# Patient Record
Sex: Female | Born: 1960
Health system: Southern US, Community
[De-identification: ages and names within clinical notes are randomized; demographics above are authoritative.]

## PROBLEM LIST (undated history)

## (undated) DIAGNOSIS — J45909 Unspecified asthma, uncomplicated: Secondary | ICD-10-CM

## (undated) DIAGNOSIS — I10 Essential (primary) hypertension: Secondary | ICD-10-CM

## (undated) DIAGNOSIS — T7840XA Allergy, unspecified, initial encounter: Secondary | ICD-10-CM

## (undated) DIAGNOSIS — M549 Dorsalgia, unspecified: Secondary | ICD-10-CM

## (undated) DIAGNOSIS — K5790 Diverticulosis of intestine, part unspecified, without perforation or abscess without bleeding: Secondary | ICD-10-CM

## (undated) DIAGNOSIS — M1990 Unspecified osteoarthritis, unspecified site: Secondary | ICD-10-CM

## (undated) DIAGNOSIS — Z5189 Encounter for other specified aftercare: Secondary | ICD-10-CM

## (undated) HISTORY — PX: POLYPECTOMY: SHX149

## (undated) HISTORY — DX: Essential (primary) hypertension: I10

## (undated) HISTORY — PX: TUBAL LIGATION: SHX77

## (undated) HISTORY — DX: Dorsalgia, unspecified: M54.9

## (undated) HISTORY — DX: Unspecified osteoarthritis, unspecified site: M19.90

## (undated) HISTORY — DX: Allergy, unspecified, initial encounter: T78.40XA

## (undated) HISTORY — DX: Diverticulosis of intestine, part unspecified, without perforation or abscess without bleeding: K57.90

## (undated) HISTORY — DX: Unspecified asthma, uncomplicated: J45.909

## (undated) HISTORY — DX: Encounter for other specified aftercare: Z51.89

## (undated) HISTORY — PX: TEMPOROMANDIBULAR JOINT ARTHROSCOPY: SUR97

## (undated) HISTORY — PX: COLONOSCOPY: SHX174

## (undated) HISTORY — PX: CHOLECYSTECTOMY: SHX55

---

## 1998-08-13 ENCOUNTER — Encounter (INDEPENDENT_AMBULATORY_CARE_PROVIDER_SITE_OTHER): Payer: Self-pay

## 1998-08-13 ENCOUNTER — Observation Stay (HOSPITAL_COMMUNITY): Admission: RE | Admit: 1998-08-13 | Discharge: 1998-08-14 | Payer: Self-pay | Admitting: Surgery

## 1999-05-22 ENCOUNTER — Encounter: Admission: RE | Admit: 1999-05-22 | Discharge: 1999-05-22 | Payer: Self-pay | Admitting: *Deleted

## 1999-06-04 ENCOUNTER — Ambulatory Visit (HOSPITAL_COMMUNITY): Admission: RE | Admit: 1999-06-04 | Discharge: 1999-06-04 | Payer: Self-pay | Admitting: *Deleted

## 1999-06-18 ENCOUNTER — Ambulatory Visit (HOSPITAL_COMMUNITY): Admission: RE | Admit: 1999-06-18 | Discharge: 1999-06-18 | Payer: Self-pay | Admitting: *Deleted

## 1999-07-03 ENCOUNTER — Ambulatory Visit (HOSPITAL_COMMUNITY): Admission: RE | Admit: 1999-07-03 | Discharge: 1999-07-03 | Payer: Self-pay | Admitting: *Deleted

## 2000-09-28 ENCOUNTER — Encounter: Payer: Self-pay | Admitting: Neurosurgery

## 2000-09-28 ENCOUNTER — Encounter: Admission: RE | Admit: 2000-09-28 | Discharge: 2000-09-28 | Payer: Self-pay | Admitting: Neurosurgery

## 2000-10-12 ENCOUNTER — Encounter: Payer: Self-pay | Admitting: Neurosurgery

## 2000-10-12 ENCOUNTER — Encounter: Admission: RE | Admit: 2000-10-12 | Discharge: 2000-10-12 | Payer: Self-pay | Admitting: Neurosurgery

## 2001-04-28 ENCOUNTER — Encounter: Payer: Self-pay | Admitting: Obstetrics and Gynecology

## 2001-04-28 ENCOUNTER — Encounter: Admission: RE | Admit: 2001-04-28 | Discharge: 2001-04-28 | Payer: Self-pay | Admitting: Obstetrics and Gynecology

## 2001-04-29 ENCOUNTER — Encounter (INDEPENDENT_AMBULATORY_CARE_PROVIDER_SITE_OTHER): Payer: Self-pay | Admitting: Specialist

## 2001-04-29 ENCOUNTER — Ambulatory Visit (HOSPITAL_BASED_OUTPATIENT_CLINIC_OR_DEPARTMENT_OTHER): Admission: RE | Admit: 2001-04-29 | Discharge: 2001-04-29 | Payer: Self-pay | Admitting: Obstetrics and Gynecology

## 2003-04-07 ENCOUNTER — Encounter: Admission: RE | Admit: 2003-04-07 | Discharge: 2003-04-07 | Payer: Self-pay | Admitting: Neurosurgery

## 2003-04-21 ENCOUNTER — Encounter: Admission: RE | Admit: 2003-04-21 | Discharge: 2003-04-21 | Payer: Self-pay | Admitting: Neurosurgery

## 2006-10-30 ENCOUNTER — Ambulatory Visit: Payer: Self-pay | Admitting: Gastroenterology

## 2006-11-13 ENCOUNTER — Ambulatory Visit: Payer: Self-pay | Admitting: Gastroenterology

## 2006-11-13 ENCOUNTER — Encounter: Payer: Self-pay | Admitting: Gastroenterology

## 2008-12-05 ENCOUNTER — Encounter: Payer: Self-pay | Admitting: Gastroenterology

## 2008-12-28 ENCOUNTER — Encounter: Admission: RE | Admit: 2008-12-28 | Discharge: 2008-12-28 | Payer: Self-pay | Admitting: Emergency Medicine

## 2008-12-28 ENCOUNTER — Encounter (INDEPENDENT_AMBULATORY_CARE_PROVIDER_SITE_OTHER): Payer: Self-pay | Admitting: *Deleted

## 2009-01-06 ENCOUNTER — Encounter: Admission: RE | Admit: 2009-01-06 | Discharge: 2009-01-06 | Payer: Self-pay | Admitting: Emergency Medicine

## 2009-01-06 ENCOUNTER — Encounter (INDEPENDENT_AMBULATORY_CARE_PROVIDER_SITE_OTHER): Payer: Self-pay | Admitting: *Deleted

## 2009-01-16 ENCOUNTER — Encounter (INDEPENDENT_AMBULATORY_CARE_PROVIDER_SITE_OTHER): Payer: Self-pay | Admitting: *Deleted

## 2009-02-12 ENCOUNTER — Ambulatory Visit: Payer: Self-pay | Admitting: Gastroenterology

## 2009-02-12 DIAGNOSIS — R7401 Elevation of levels of liver transaminase levels: Secondary | ICD-10-CM | POA: Insufficient documentation

## 2009-02-12 DIAGNOSIS — R740 Nonspecific elevation of levels of transaminase and lactic acid dehydrogenase [LDH]: Secondary | ICD-10-CM

## 2009-02-12 DIAGNOSIS — R933 Abnormal findings on diagnostic imaging of other parts of digestive tract: Secondary | ICD-10-CM | POA: Insufficient documentation

## 2009-05-31 ENCOUNTER — Encounter: Payer: Self-pay | Admitting: Gastroenterology

## 2009-06-19 ENCOUNTER — Telehealth: Payer: Self-pay | Admitting: Gastroenterology

## 2009-06-25 ENCOUNTER — Telehealth: Payer: Self-pay | Admitting: Gastroenterology

## 2009-06-27 ENCOUNTER — Ambulatory Visit: Payer: Self-pay | Admitting: Cardiovascular Disease

## 2009-07-10 ENCOUNTER — Encounter: Payer: Self-pay | Admitting: Gastroenterology

## 2010-03-17 ENCOUNTER — Encounter: Payer: Self-pay | Admitting: Gastroenterology

## 2010-03-26 NOTE — Letter (Signed)
Summary: [facility]  [facility]   Imported By: Sherian Rein 06/18/2009 08:00:30  _____________________________________________________________________  External Attachment:    Type:   Image     Comment:   External Document

## 2010-03-26 NOTE — Progress Notes (Signed)
Summary: Schedule MRI   Phone Note Outgoing Call Call back at [facility] Phone [phone] Call back at Work Phone [phone]   Call placed by: Christie Nottingham CMA Duncan Dull),  June 19, 2009 10:10 AM Call placed to: Patient Summary of Call: Scheduled MRI of the abdomen with and without contrast to follow-up for the liver lesions. Pt notified it is on 06/25/09 at 9:45am at [facility] (315 location). Pt states she might have to call and reschedule for later on in the week. Phone number given to pt.  Initial call taken by: Christie Nottingham CMA Duncan Dull),  June 19, 2009 10:11 AM

## 2010-03-26 NOTE — Progress Notes (Signed)
Summary: Schedule CT scan   Phone Note Outgoing Call Call back at [facility] Phone [phone]   Call placed by: Christie Nottingham CMA Duncan Dull),  Jun 25, 2009 2:10 PM Call placed to: Patient Summary of Call: Pt's insurance company would not pay for a MRI of the abdomen per Morrie Sheldon (Presenter, broadcasting). Pt has been approved for a CT of the liver. Pt has been rescheduled to 06/27/09 at 9:30am at [facility] CT. Pt has been notified and states she will come and pick up instructions and contrast from the front desk.  Initial call taken by: Christie Nottingham CMA Duncan Dull),  Jun 25, 2009 2:12 PM

## 2010-04-01 ENCOUNTER — Other Ambulatory Visit: Payer: Self-pay | Admitting: Emergency Medicine

## 2010-04-01 DIAGNOSIS — K769 Liver disease, unspecified: Secondary | ICD-10-CM

## 2010-04-04 ENCOUNTER — Encounter (INDEPENDENT_AMBULATORY_CARE_PROVIDER_SITE_OTHER): Payer: Self-pay | Admitting: *Deleted

## 2010-04-04 ENCOUNTER — Encounter: Payer: Self-pay | Admitting: Gastroenterology

## 2010-04-04 ENCOUNTER — Ambulatory Visit
Admission: RE | Admit: 2010-04-04 | Discharge: 2010-04-04 | Disposition: A | Discharge status: Home / Self Care | Payer: 59 | Source: Ambulatory Visit | Attending: Emergency Medicine | Admitting: Emergency Medicine

## 2010-04-04 DIAGNOSIS — K769 Liver disease, unspecified: Secondary | ICD-10-CM

## 2010-04-04 MED ORDER — GADOBENATE DIMEGLUMINE 529 MG/ML IV SOLN
15.0000 mL | Freq: Once | INTRAVENOUS | Status: AC | PRN
  Administered 2010-04-04: 15 mL via INTRAVENOUS

## 2010-07-12 NOTE — Op Note (Signed)
[facility]  Patient:    Theresa Bell, Theresa Bell Visit Number: [id number] MRN: [id number]          Service Type: NES Location: [facility] Attending Physician:  Lendon Colonel Dictated by:   Kathie Rhodes. Kyra Manges, M.D. Proc. Date: 04/29/01 Admit Date:  04/29/2001                             Operative Report  PREOPERATIVE DIAGNOSES: 1. Pelvic pain. 2. Abnormal uterine bleeding.  POSTOPERATIVE DIAGNOSIS:  Essentially normal pelvis.  OPERATION:  Laparoscopic and hysteroscopy with resection of posterior endometrial nodule.  DESCRIPTION OF PROCEDURE:  The patient was placed in lithotomy position, prepped and draped in the usual fashion.  Bladder was emptied.  The patient had been positioned in the stirrups prior to being anesthetized because of her history of what was thought to be a herniated nucleus.  Transverse incision was made in the umbilicus after prep and drape, and the bladder was emptied, and the abdomen was distended with carbon dioxide.  The trocar was inserted into the abdomen and visualization of the pelvis accomplishes.  She was status post tubal ligation with two normal ovaries.  The uterosacral ligaments were traced to their insertion and were normal.  The pelvic brim and ______ were all normal.  Anteriorly, she was normal as well.  Pictures were taken of both ovaries of the cul-de-sac and anterior and posterior cul-de-sacs.  Upper abdomen was quite normal as well.  Nothing was done; the gas was evacuated, and the skin was closed with 0 Vicryl on the UR-6 needle for the umbilicus.  I had placed a second midline lower port to manipulate the ovaries, and this was closed with 3-0 Vicryl as well as the skin for the umbilicus with 3-0 Vicryl. The incisions were then infiltrated with 0.5% Marcaine with epinephrine.  We went down below and carefully dilated the cervix and inserted the resectoscope into the cervix.  Visualization of the endometrial  cavity was quite normal. There was a posterior nodule which I think was a small myoma which I resected very carefully.  The resected material was sent to the lab for study.  [name] was then awakened and carried to the recovery room in good condition. Dictated by:   S. Kyra Manges, M.D. Attending Physician:  Lendon Colonel DD:  04/29/01 TD:  04/29/01 Job: [id number]

## 2011-03-31 ENCOUNTER — Telehealth: Payer: Self-pay

## 2011-03-31 DIAGNOSIS — R933 Abnormal findings on diagnostic imaging of other parts of digestive tract: Secondary | ICD-10-CM

## 2011-03-31 NOTE — Telephone Encounter (Signed)
Scheduled yearly Hepatic MRI to follow up on the Hepatic lobe lesion from 03/2010 with [facility] for 04/10/11 at 9:30am at the [address] [facility] location. Patient informed of date and time of MRI and to have nothing to eat or drink after midnight before the test.

## 2011-04-01 ENCOUNTER — Other Ambulatory Visit: Payer: Self-pay | Admitting: Emergency Medicine

## 2011-04-01 NOTE — Telephone Encounter (Signed)
Needs CPE 

## 2011-04-03 ENCOUNTER — Other Ambulatory Visit: Payer: Self-pay | Admitting: Emergency Medicine

## 2011-04-09 ENCOUNTER — Other Ambulatory Visit: Payer: Self-pay | Admitting: Family Medicine

## 2011-04-09 MED ORDER — LISINOPRIL 10 MG PO TABS: 10.0000 mg | ORAL_TABLET | Freq: Every day | ORAL | Status: DC

## 2011-04-10 ENCOUNTER — Ambulatory Visit
Admission: RE | Admit: 2011-04-10 | Discharge: 2011-04-10 | Disposition: A | Discharge status: Home / Self Care | Payer: BC Managed Care – PPO | Source: Ambulatory Visit | Attending: Gastroenterology | Admitting: Gastroenterology

## 2011-04-10 ENCOUNTER — Other Ambulatory Visit: Payer: Self-pay

## 2011-04-10 DIAGNOSIS — R7401 Elevation of levels of liver transaminase levels: Secondary | ICD-10-CM

## 2011-04-10 DIAGNOSIS — R933 Abnormal findings on diagnostic imaging of other parts of digestive tract: Secondary | ICD-10-CM

## 2011-04-10 MED ORDER — GADOBENATE DIMEGLUMINE 529 MG/ML IV SOLN
15.0000 mL | Freq: Once | INTRAVENOUS | Status: AC | PRN
  Administered 2011-04-10: 15 mL via INTRAVENOUS

## 2011-04-11 ENCOUNTER — Other Ambulatory Visit: Payer: Self-pay

## 2011-04-11 ENCOUNTER — Other Ambulatory Visit (INDEPENDENT_AMBULATORY_CARE_PROVIDER_SITE_OTHER): Payer: BC Managed Care – PPO

## 2011-04-11 DIAGNOSIS — R7401 Elevation of levels of liver transaminase levels: Secondary | ICD-10-CM

## 2011-04-11 LAB — HEPATIC FUNCTION PANEL
ALT: 51 U/L — ABNORMAL HIGH (ref 0–35)
AST: 42 U/L — ABNORMAL HIGH (ref 0–37)
Albumin: 3.5 g/dL (ref 3.5–5.2)
Alkaline Phosphatase: 66 U/L (ref 39–117)
Bilirubin, Direct: 0.2 mg/dL (ref 0.0–0.3)
Total Bilirubin: 1.1 mg/dL (ref 0.3–1.2)
Total Protein: 7.2 g/dL (ref 6.0–8.3)

## 2011-04-26 ENCOUNTER — Ambulatory Visit (INDEPENDENT_AMBULATORY_CARE_PROVIDER_SITE_OTHER): Payer: BC Managed Care – PPO | Admitting: Emergency Medicine

## 2011-04-26 DIAGNOSIS — E559 Vitamin D deficiency, unspecified: Secondary | ICD-10-CM | POA: Insufficient documentation

## 2011-04-26 DIAGNOSIS — E789 Disorder of lipoprotein metabolism, unspecified: Secondary | ICD-10-CM

## 2011-04-26 DIAGNOSIS — I10 Essential (primary) hypertension: Secondary | ICD-10-CM

## 2011-04-26 DIAGNOSIS — Z79890 Hormone replacement therapy (postmenopausal): Secondary | ICD-10-CM | POA: Insufficient documentation

## 2011-04-26 DIAGNOSIS — K7689 Other specified diseases of liver: Secondary | ICD-10-CM

## 2011-04-26 MED ORDER — NORETHINDRONE ACETATE 5 MG PO TABS: 5.0000 mg | ORAL_TABLET | Freq: Every day | ORAL | Status: DC

## 2011-04-26 MED ORDER — TRIAMTERENE-HCTZ 37.5-25 MG PO TABS: 1.0000 | ORAL_TABLET | Freq: Every morning | ORAL | Status: DC

## 2011-04-26 MED ORDER — LISINOPRIL 10 MG PO TABS: 10.0000 mg | ORAL_TABLET | Freq: Every day | ORAL | Status: DC

## 2011-04-26 MED ORDER — VITAMIN D (ERGOCALCIFEROL) 1.25 MG (50000 UNIT) PO CAPS: 50000.0000 [IU] | ORAL_CAPSULE | ORAL | Status: DC

## 2011-04-26 NOTE — Progress Notes (Signed)
  Subjective:    Patient ID: Theresa Bell, female    DOB: 06-Aug-1960, 51 y.o.   MRN: [id number]  HPI patient enters for followup of her hypertension. She's been continuing on her medication but does not have a followup appointment for approximately 2 months. She is out of her medications and needs medication refills.    Review of Systems specifically patient denies chest pain shortness of breath or any other complaints at this time.     Objective:   Physical Exam HEENT exam is within normal limits neck supple chest clear heart regular rate no murmurs rubs or gallops abdomen is soft without tenderness.        Assessment & Plan:  Assessment is hypertension under good control. Patient also has vitamin D deficiency and is on vitamin D replacement. She also has menstrual problems and is currently on Norethindrone .Marland Kitchen

## 2011-07-15 ENCOUNTER — Other Ambulatory Visit (INDEPENDENT_AMBULATORY_CARE_PROVIDER_SITE_OTHER): Payer: BC Managed Care – PPO

## 2011-07-15 ENCOUNTER — Other Ambulatory Visit: Payer: Self-pay

## 2011-07-15 DIAGNOSIS — R7401 Elevation of levels of liver transaminase levels: Secondary | ICD-10-CM

## 2011-07-15 DIAGNOSIS — R7989 Other specified abnormal findings of blood chemistry: Secondary | ICD-10-CM

## 2011-07-15 LAB — HEPATIC FUNCTION PANEL
AST: 58 U/L — ABNORMAL HIGH (ref 0–37)
Alkaline Phosphatase: 60 U/L (ref 39–117)
Total Bilirubin: 1.2 mg/dL (ref 0.3–1.2)

## 2011-07-16 ENCOUNTER — Other Ambulatory Visit (INDEPENDENT_AMBULATORY_CARE_PROVIDER_SITE_OTHER): Payer: BC Managed Care – PPO

## 2011-07-16 DIAGNOSIS — R7989 Other specified abnormal findings of blood chemistry: Secondary | ICD-10-CM

## 2011-07-16 LAB — FERRITIN: Ferritin: 82.2 ng/mL (ref 10.0–291.0)

## 2011-07-16 LAB — PROTIME-INR: INR: 1 ratio (ref 0.8–1.0)

## 2011-07-16 LAB — IRON: Iron: 76 ug/dL (ref 42–145)

## 2011-07-17 LAB — ANTI-SMOOTH MUSCLE ANTIBODY, IGG: Smooth Muscle Ab: 5 U (ref <20)

## 2011-07-17 LAB — HEPATITIS B SURFACE ANTIGEN: Hepatitis B Surface Ag: NEGATIVE

## 2011-07-17 LAB — HEPATITIS B SURFACE ANTIBODY,QUALITATIVE: Hep B S Ab: POSITIVE — AB

## 2011-07-17 LAB — CERULOPLASMIN: Ceruloplasmin: 34 mg/dL (ref 20–60)

## 2011-07-17 LAB — ALPHA-1-ANTITRYPSIN: A-1 Antitrypsin, Ser: 153 mg/dL (ref 90–200)

## 2011-07-17 LAB — ANA: Anti Nuclear Antibody(ANA): POSITIVE — AB

## 2011-07-17 LAB — HEPATITIS C ANTIBODY: HCV Ab: NEGATIVE

## 2011-07-17 LAB — MITOCHONDRIAL ANTIBODIES: Mitochondrial M2 Ab, IgG: 0.38 (ref <0.91)

## 2011-09-24 ENCOUNTER — Encounter: Payer: Self-pay | Admitting: Gastroenterology

## 2011-11-05 ENCOUNTER — Encounter: Payer: Self-pay | Admitting: Emergency Medicine

## 2011-11-10 ENCOUNTER — Ambulatory Visit (AMBULATORY_SURGERY_CENTER): Payer: BC Managed Care – PPO

## 2011-11-10 VITALS — Ht 64.5 in | Wt 182.0 lb

## 2011-11-10 DIAGNOSIS — Z800 Family history of malignant neoplasm of digestive organs: Secondary | ICD-10-CM

## 2011-11-10 DIAGNOSIS — Z1211 Encounter for screening for malignant neoplasm of colon: Secondary | ICD-10-CM

## 2011-11-10 MED ORDER — MOVIPREP 100 G PO SOLR: ORAL | Status: DC

## 2011-11-11 ENCOUNTER — Encounter: Payer: Self-pay | Admitting: Gastroenterology

## 2011-12-01 ENCOUNTER — Ambulatory Visit (AMBULATORY_SURGERY_CENTER): Payer: BC Managed Care – PPO | Admitting: Gastroenterology

## 2011-12-01 ENCOUNTER — Encounter: Payer: Self-pay | Admitting: Gastroenterology

## 2011-12-01 VITALS — BP 115/63 | HR 89 | Temp 98.2°F | Resp 18 | Ht 64.5 in | Wt 182.0 lb

## 2011-12-01 DIAGNOSIS — Z1211 Encounter for screening for malignant neoplasm of colon: Secondary | ICD-10-CM

## 2011-12-01 DIAGNOSIS — Z800 Family history of malignant neoplasm of digestive organs: Secondary | ICD-10-CM

## 2011-12-01 MED ORDER — SODIUM CHLORIDE 0.9 % IV SOLN: 500.0000 mL | INTRAVENOUS | Status: DC

## 2011-12-01 NOTE — Op Note (Addendum)
[facility] [address] [place], [address]   COLONOSCOPY PROCEDURE REPORT  PATIENT: Theresa Bell, Theresa Bell  MR#: [id number] BIRTHDATE: 01/18/1961 , 51  yrs. old GENDER: Female ENDOSCOPIST: Meryl Dare, MD, [facility] PROCEDURE DATE:  12/01/2011 PROCEDURE:   Colonoscopy, diagnostic ASA CLASS:   Class II INDICATIONS:patient's family history of colon cancer, distant relatives: MGM age 50, maternal 1st cousin age 64 MEDICATIONS: MAC sedation, administered by CRNA and propofol (Diprivan) 180mg  IV DESCRIPTION OF PROCEDURE:   After the risks benefits and alternatives of the procedure were thoroughly explained, informed consent was obtained.  A digital rectal exam revealed no abnormalities of the rectum.   The LB CF-[id number]  endoscope was introduced through the anus and advanced to the cecum, which was identified by both the appendix and ileocecal valve. No adverse events experienced.   The quality of the prep was good, using MoviPrep  The instrument was then slowly withdrawn as the colon was fully examined.  COLON FINDINGS: Mild diverticulosis was noted in the sigmoid colon. The colon was otherwise normal.  There was no diverticulosis, inflammation, polyps or cancers unless previously stated. Retroflexed views revealed no abnormalities. The time to cecum=1 minutes 26 seconds.  Withdrawal time=7 minutes 14 seconds.  The scope was withdrawn and the procedure completed. COMPLICATIONS: There were no complications.  ENDOSCOPIC IMPRESSION: 1.   Mild diverticulosis was noted in the sigmoid colon 2.   The colon was otherwise normal  RECOMMENDATIONS: 1.  High fiber diet with liberal fluid intake. 2.  Repeat Colonoscopy in 5 years.   eSigned:  Meryl Dare, MD, [facility] 12/01/2011 9:56 AM Revised: 12/01/2011 9:56 AM  cc: Lesle Chris, MD

## 2011-12-01 NOTE — Progress Notes (Signed)
Propofol per B Gordy Levan CRNA, see scanned intra procedure report. All  meds titrated during procedure per CRNA. ewm

## 2011-12-01 NOTE — Patient Instructions (Signed)
YOU HAD AN ENDOSCOPIC PROCEDURE TODAY AT THE  ENDOSCOPY CENTER: Refer to the procedure report that was given to you for any specific questions about what was found during the examination.  If the procedure report does not answer your questions, please call your gastroenterologist to clarify.  If you requested that your care partner not be given the details of your procedure findings, then the procedure report has been included in a sealed envelope for you to review at your convenience later.  YOU SHOULD EXPECT: Some feelings of bloating in the abdomen. Passage of more gas than usual.  Walking can help get rid of the air that was put into your GI tract during the procedure and reduce the bloating. If you had a lower endoscopy (such as a colonoscopy or flexible sigmoidoscopy) you may notice spotting of blood in your stool or on the toilet paper. If you underwent a bowel prep for your procedure, then you may not have a normal bowel movement for a few days.  DIET: Your first meal following the procedure should be a light meal and then it is ok to progress to your normal diet.  A half-sandwich or bowl of soup is an example of a good first meal.  Heavy or fried foods are harder to digest and may make you feel nauseous or bloated.  Likewise meals heavy in dairy and vegetables can cause extra gas to form and this can also increase the bloating.  Drink plenty of fluids but you should avoid alcoholic beverages for 24 hours.  ACTIVITY: Your care partner should take you home directly after the procedure.  You should plan to take it easy, moving slowly for the rest of the day.  You can resume normal activity the day after the procedure however you should NOT DRIVE or use heavy machinery for 24 hours (because of the sedation medicines used during the test).    SYMPTOMS TO REPORT IMMEDIATELY: A gastroenterologist can be reached at any hour.  During normal business hours, 8:30 AM to 5:00 PM Monday through Friday,  call [phone].  After hours and on weekends, please call the GI answering service at [phone] who will take a message and have the physician on call contact you.   Following lower endoscopy (colonoscopy or flexible sigmoidoscopy):  Excessive amounts of blood in the stool  Significant tenderness or worsening of abdominal pains  Swelling of the abdomen that is new, acute  Fever of 100F or higher   FOLLOW UP: If any biopsies were taken you will be contacted by phone or by letter within the next 1-3 weeks.  Call your gastroenterologist if you have not heard about the biopsies in 3 weeks.  Our staff will call the home number listed on your records the next business day following your procedure to check on you and address any questions or concerns that you may have at that time regarding the information given to you following your procedure. This is a courtesy call and so if there is no answer at the home number and we have not heard from you through the emergency physician on call, we will assume that you have returned to your regular daily activities without incident.  SIGNATURES/CONFIDENTIALITY: You and/or your care partner have signed paperwork which will be entered into your electronic medical record.  These signatures attest to the fact that that the information above on your After Visit Summary has been reviewed and is understood.  Full responsibility of the confidentiality of   this discharge information lies with you and/or your care-partner.    INFORMATION ON DIVERTICULOSIS & HIGH FIBER DIET GIVEN TO YOU TODAY 

## 2011-12-02 ENCOUNTER — Telehealth: Payer: Self-pay | Admitting: *Deleted

## 2011-12-02 NOTE — Telephone Encounter (Signed)
Message left

## 2012-02-06 ENCOUNTER — Ambulatory Visit: Payer: BC Managed Care – PPO

## 2012-02-06 ENCOUNTER — Ambulatory Visit (INDEPENDENT_AMBULATORY_CARE_PROVIDER_SITE_OTHER): Payer: BC Managed Care – PPO | Admitting: Emergency Medicine

## 2012-02-06 VITALS — BP 139/84 | HR 86 | Temp 98.1°F | Resp 16 | Ht 64.0 in | Wt 179.0 lb

## 2012-02-06 DIAGNOSIS — R82998 Other abnormal findings in urine: Secondary | ICD-10-CM

## 2012-02-06 DIAGNOSIS — N889 Noninflammatory disorder of cervix uteri, unspecified: Secondary | ICD-10-CM

## 2012-02-06 DIAGNOSIS — R9389 Abnormal findings on diagnostic imaging of other specified body structures: Secondary | ICD-10-CM

## 2012-02-06 DIAGNOSIS — I10 Essential (primary) hypertension: Secondary | ICD-10-CM

## 2012-02-06 DIAGNOSIS — Z0000 Encounter for general adult medical examination without abnormal findings: Secondary | ICD-10-CM

## 2012-02-06 DIAGNOSIS — R8281 Pyuria: Secondary | ICD-10-CM

## 2012-02-06 DIAGNOSIS — Z79890 Hormone replacement therapy (postmenopausal): Secondary | ICD-10-CM

## 2012-02-06 DIAGNOSIS — R7989 Other specified abnormal findings of blood chemistry: Secondary | ICD-10-CM

## 2012-02-06 LAB — COMPREHENSIVE METABOLIC PANEL
ALT: 38 U/L — ABNORMAL HIGH (ref 0–35)
AST: 29 U/L (ref 0–37)
Albumin: 4.1 g/dL (ref 3.5–5.2)
Alkaline Phosphatase: 65 U/L (ref 39–117)
BUN: 8 mg/dL (ref 6–23)
CO2: 27 mEq/L (ref 19–32)
Calcium: 9.3 mg/dL (ref 8.4–10.5)
Chloride: 101 mEq/L (ref 96–112)
Creat: 0.98 mg/dL (ref 0.50–1.10)
Glucose, Bld: 103 mg/dL — ABNORMAL HIGH (ref 70–99)
Potassium: 4.1 mEq/L (ref 3.5–5.3)
Sodium: 137 mEq/L (ref 135–145)
Total Bilirubin: 1.4 mg/dL — ABNORMAL HIGH (ref 0.3–1.2)
Total Protein: 6.8 g/dL (ref 6.0–8.3)

## 2012-02-06 LAB — POCT UA - MICROSCOPIC ONLY
Casts, Ur, LPF, POC: NEGATIVE
Crystals, Ur, HPF, POC: NEGATIVE
Mucus, UA: NEGATIVE

## 2012-02-06 LAB — POCT URINALYSIS DIPSTICK
Spec Grav, UA: >=1.03
Urobilinogen, UA: 0.2

## 2012-02-06 LAB — LIPID PANEL
Cholesterol: 165 mg/dL (ref 0–200)
Total CHOL/HDL Ratio: 5.3 Ratio
Triglycerides: 146 mg/dL (ref <150)
VLDL: 29 mg/dL (ref 0–40)

## 2012-02-06 LAB — POCT CBC
Hemoglobin: 15.3 g/dL (ref 12.2–16.2)
Lymph, poc: 1.2 (ref 0.6–3.4)
MCH, POC: 28.3 pg (ref 27–31.2)
MCV: 90.1 fL (ref 80–97)
MID (cbc): 0.5 (ref 0–0.9)
Platelet Count, POC: 436 10*3/uL — AB (ref 142–424)
RBC: 5.41 M/uL (ref 4.04–5.48)
WBC: 6.2 10*3/uL (ref 4.6–10.2)

## 2012-02-06 MED ORDER — TRIAMTERENE-HCTZ 37.5-25 MG PO TABS: 1.0000 | ORAL_TABLET | Freq: Every morning | ORAL | Status: DC

## 2012-02-06 MED ORDER — NORETHINDRONE ACETATE 5 MG PO TABS: 5.0000 mg | ORAL_TABLET | Freq: Every day | ORAL | Status: DC

## 2012-02-06 MED ORDER — LISINOPRIL 10 MG PO TABS: 20.0000 mg | ORAL_TABLET | Freq: Every day | ORAL | Status: DC

## 2012-02-06 MED ORDER — LISINOPRIL 10 MG PO TABS: 10.0000 mg | ORAL_TABLET | Freq: Every day | ORAL | Status: DC

## 2012-02-06 NOTE — Progress Notes (Signed)
@[email or web address] @  Patient ID: Theresa Bell MRN: [id number], DOB: 20-Jun-1960, 51 y.o. Date of Encounter: 02/06/2012, 9:23 AM  Primary Physician: Lucilla Edin, MD  Chief Complaint: Physical (CPE)  HPI: 51 y.o. y/o female with history of noted below here for CPE.  Doing well. No issues/complaints.  LMP:  Pap: MMG: Review of Systems:  Consitutional: No fever, chills, fatigue, night sweats, lymphadenopathy, or weight changes. Eyes: No visual changes, eye redness, or discharge. ENT/Mouth: Ears: No otalgia, tinnitus, hearing loss, discharge. Nose: No congestion, rhinorrhea, sinus pain, or epistaxis. Throat: No sore throat, post nasal drip, or teeth pain. Cardiovascular: No CP, palpitations, diaphoresis, DOE, edema, orthopnea, PND. Respiratory: She has a history of exercise-induced asthma she has Dulera to take but rarely takes it. As a pro-air inhaler to use as  Gastrointestinal: No anorexia, dysphagia, reflux, pain, nausea, vomiting, hematemesis, diarrhea, constipation, BRBPR, or melena. Breast: No discharge, pain, swelling, or mass. Genitourinary: No dysuria, frequency, urgency, hematuria, incontinence, nocturia, amenorrhea, vaginal discharge, pruritis, burning, abnormal bleeding, or pain. Musculoskeletal: No decreased ROM, myalgias, stiffness, joint swelling, or weakness. Skin: No rash, erythema, lesion changes, pain, warmth, jaundice, or pruritis. Neurological: No headache, dizziness, syncope, seizures, tremors, memory loss, coordination problems, or paresthesias. Psychological: No anxiety, depression, hallucinations, SI/HI. Endocrine: No fatigue, polydipsia, polyphagia, polyuria, or known diabetes. All other systems were reviewed and are otherwise negative.  Past Medical History  Diagnosis Date  . Hypertension   . Back pain   . Allergy      Past Surgical History  Procedure Date  . Tubal ligation   . Cholecystectomy   . Colonoscopy   . Polypectomy   . Temporomandibular joint  arthroscopy     Home Meds:  Prior to Admission medications   Medication Sig Start Date End Date Taking? Authorizing Provider  Ergocalciferol (VITAMIN D2 PO) Take 1.25 mg by mouth daily.   Yes Historical Provider, MD  lisinopril (PRINIVIL,ZESTRIL) 10 MG tablet Take 1 tablet (10 mg total) by mouth daily. 04/26/11 04/25/12 Yes Collene Gobble, MD  norethindrone (AYGESTIN) 5 MG tablet Take 1 tablet (5 mg total) by mouth daily. 04/26/11  Yes Collene Gobble, MD  triamterene-hydrochlorothiazide (MAXZIDE-25) 37.5-25 MG per tablet Take 1 each (1 tablet total) by mouth every morning. 04/26/11  Yes Collene Gobble, MD  Vitamin D, Ergocalciferol, (DRISDOL) 50000 UNITS CAPS Take 1 capsule (50,000 Units total) by mouth every 7 (seven) days. 04/26/11  Yes Collene Gobble, MD    Allergies:  Allergies  Allergen Reactions  . Codeine     REACTION: Nausea  . Darvocet (Propoxyphene-Acetaminophen)   . Darvon     History   Social History  . Marital Status: Married    Spouse Name: N/A    Number of Children: N/A  . Years of Education: N/A   Occupational History  . Not on file.   Social History Main Topics  . Smoking status: Former Smoker    Types: Cigarettes    Start date: 11/10/1982  . Smokeless tobacco: Never Used  . Alcohol Use: 2.5 oz/week    5 drink(s) per week  . Drug Use: No  . Sexually Active: Not on file   Other Topics Concern  . Not on file   Social History Narrative  . No narrative on file    Family History  Problem Relation Age of Onset  . Breast cancer Paternal Aunt   . Uterine cancer Maternal Grandmother   . Colon cancer Paternal Grandmother     Physical  Exam  Blood pressure 139/84, pulse 86, temperature 98.1 F (36.7 C), temperature source Oral, resp. rate 16, height 5\' 4"  (1.626 m), weight 179 lb (81.194 kg), SpO2 100.00%., Body mass index is 30.73 kg/(m^2). General: Well developed, well nourished, in no acute distress. HEENT: Normocephalic, atraumatic. Conjunctiva pink, sclera  non-icteric. Pupils 2 mm constricting to 1 mm, round, regular, and equally reactive to light and accomodation. EOMI. Internal auditory canal clear. TMs with good cone of light and without pathology. Nasal mucosa pink. Nares are without discharge. No sinus tenderness. Oral mucosa pink. Dentitionnormal. Pharynx without exudate.   Neck: Supple. Trachea midline. No thyromegaly. Full ROM. No lymphadenopathy. Lungs: Clear to auscultation bilaterally without wheezes, rales, or rhonchi. Breathing is of normal effort and unlabored. Cardiovascular: RRR with S1 S2. No murmurs, rubs, or gallops appreciated. Distal pulses 2+ symmetrically. No carotid or abdominal bruits.  Breast: There are no breast masses palpable  Abdomen: Soft, non-tender, non-distended with normoactive bowel sounds. No hepatosplenomegaly or masses. No rebound/guarding. No CVA tenderness. Without hernias.  Genitourinary: The area around the cervical os is extremely friable. It bleeds freely. There are no adnexal masses present there is no uterine mass. Rectal was also performed and he was sure was sent .    Musculoskeletal: Full range of motion and 5/5 strength throughout. Without swelling, atrophy, tenderness, crepitus, or warmth. Extremities without clubbing, cyanosis, or edema. Calves supple. Skin: Warm and moist without erythema, ecchymosis, wounds, or rash. Neuro: A+Ox3. CN II-XII grossly intact. Moves all extremities spontaneously. Full sensation throughout. Normal gait. DTR 2+ throughout upper and lower extremities. Finger to nose intact. Psych:  Responds to questions appropriately with a normal affect.    Results for orders placed in visit on 02/06/12  POCT CBC      Component Value Range   WBC 6.2  4.6 - 10.2 K/uL   Lymph, poc 1.2  0.6 - 3.4   POC LYMPH PERCENT 19.9  10 - 50 %L   MID (cbc) 0.5  0 - 0.9   POC MID % 7.9  0 - 12 %M   POC Granulocyte 4.5  2 - 6.9   Granulocyte percent 72.2  37 - 80 %G   RBC 5.41  4.04 - 5.48  M/uL   Hemoglobin 15.3  12.2 - 16.2 g/dL   HCT, POC 16.1 (*) 09.6 - 47.9 %   MCV 90.1  80 - 97 fL   MCH, POC 28.3  27 - 31.2 pg   MCHC 31.4 (*) 31.8 - 35.4 g/dL   RDW, POC 04.5     Platelet Count, POC 436 (*) 142 - 424 K/uL   MPV 8.5  0 - 99.8 fL  POCT UA - MICROSCOPIC ONLY      Component Value Range   WBC, Ur, HPF, POC 8-12     RBC, urine, microscopic 10-15     Bacteria, U Microscopic 1+     Mucus, UA neg     Epithelial cells, urine per micros 6-10     Crystals, Ur, HPF, POC neg     Casts, Ur, LPF, POC neg     Yeast, UA neg    POCT URINALYSIS DIPSTICK      Component Value Range   Color, UA dk yellow     Clarity, UA clear     Glucose, UA neg     Bilirubin, UA small     Ketones, UA neg     Spec Grav, UA >=1.030     Blood,  UA moderate     pH, UA 6.0     Protein, UA trace     Urobilinogen, UA 0.2     Nitrite, UA neg     Leukocytes, UA small (1+)    IFOBT (OCCULT BLOOD)      Component Value Range   IFOBT Negative     [facility] reading (PRIMARY) by  Dr.Daub on 2 spiculated areas in what  appears to be the left upper lobe.    Assessment/Plan:  51 y.o. y/o female here for CPE I'm concerned about her friable mucosa around the cervix. She is also currently on hormone therapy. Routine labs were performed. She does have an abnormal chest x-ray apical views were obtained we'll go ahead and schedule CT of the chest with contrast to evaluate these areas . -  Signed, Earl Lites, MD 02/06/2012 9:23 AM

## 2012-02-08 LAB — URINE CULTURE

## 2012-02-09 ENCOUNTER — Telehealth: Payer: Self-pay | Admitting: Radiology

## 2012-02-09 LAB — PAP IG (IMAGE GUIDED)

## 2012-02-09 NOTE — Telephone Encounter (Signed)
CT of Chest needs peer to peer [phone] memver # [id number]

## 2012-02-10 NOTE — Telephone Encounter (Signed)
auth # - [id number]

## 2012-02-23 ENCOUNTER — Ambulatory Visit
Admission: RE | Admit: 2012-02-23 | Discharge: 2012-02-23 | Disposition: A | Discharge status: Home / Self Care | Payer: BC Managed Care – PPO | Source: Ambulatory Visit | Attending: Emergency Medicine | Admitting: Emergency Medicine

## 2012-02-23 DIAGNOSIS — R9389 Abnormal findings on diagnostic imaging of other specified body structures: Secondary | ICD-10-CM

## 2012-02-23 MED ORDER — IOHEXOL 300 MG/ML  SOLN
75.0000 mL | Freq: Once | INTRAMUSCULAR | Status: AC | PRN
  Administered 2012-02-23: 75 mL via INTRAVENOUS

## 2012-03-02 ENCOUNTER — Telehealth: Payer: Self-pay

## 2012-03-02 NOTE — Telephone Encounter (Signed)
I have called her to advise.  

## 2012-03-02 NOTE — Telephone Encounter (Signed)
Since she just had a colonoscopy in October she should try some Anusol cream with hydrocortisone or Preparation H with hydrocortisone and apply that to her anal area twice a day. If she continues to have bleeding I will be happy to see her her and I will be in the office on Thursday.

## 2012-03-02 NOTE — Telephone Encounter (Signed)
Left message for her to call me back. 

## 2012-03-02 NOTE — Telephone Encounter (Signed)
Dr. Cleta Alberts   Patient is concerned.  She had blood in her stool last night and wants to know if she needs an OV.  CBN:  [phone]

## 2012-03-02 NOTE — Telephone Encounter (Signed)
Patient states she had bright red blood in her stool yesterday. I have encouraged her to come in for this if it continues. She had colonoscopy done in October 2013, it was fine. She states she had a bowel movement today and was fine, she denies any pain or discomfort. Please advise. Amy

## 2012-03-10 ENCOUNTER — Telehealth: Payer: Self-pay

## 2012-03-10 NOTE — Telephone Encounter (Signed)
Dr.s office needs the pap smear from our office,  She is in their office now   Fax number [phone]

## 2012-05-02 ENCOUNTER — Other Ambulatory Visit: Payer: Self-pay | Admitting: Emergency Medicine

## 2012-06-06 ENCOUNTER — Other Ambulatory Visit: Payer: Self-pay | Admitting: Emergency Medicine

## 2012-07-03 ENCOUNTER — Other Ambulatory Visit: Payer: Self-pay | Admitting: Emergency Medicine

## 2012-08-19 ENCOUNTER — Encounter: Payer: Self-pay | Admitting: Emergency Medicine

## 2013-02-19 ENCOUNTER — Other Ambulatory Visit: Payer: Self-pay | Admitting: Emergency Medicine

## 2013-03-25 ENCOUNTER — Other Ambulatory Visit: Payer: Self-pay | Admitting: Physician Assistant

## 2013-04-15 ENCOUNTER — Other Ambulatory Visit: Payer: Self-pay | Admitting: Physician Assistant

## 2013-04-26 ENCOUNTER — Encounter: Payer: Self-pay | Admitting: Emergency Medicine

## 2013-04-26 ENCOUNTER — Ambulatory Visit (INDEPENDENT_AMBULATORY_CARE_PROVIDER_SITE_OTHER): Payer: BC Managed Care – PPO | Admitting: Emergency Medicine

## 2013-04-26 ENCOUNTER — Other Ambulatory Visit: Payer: Self-pay | Admitting: Emergency Medicine

## 2013-04-26 VITALS — BP 106/78 | HR 97 | Temp 98.2°F | Resp 16 | Ht 63.5 in | Wt 179.4 lb

## 2013-04-26 DIAGNOSIS — F438 Other reactions to severe stress: Secondary | ICD-10-CM

## 2013-04-26 DIAGNOSIS — F430 Acute stress reaction: Secondary | ICD-10-CM

## 2013-04-26 DIAGNOSIS — I10 Essential (primary) hypertension: Secondary | ICD-10-CM

## 2013-04-26 DIAGNOSIS — F4389 Other reactions to severe stress: Secondary | ICD-10-CM

## 2013-04-26 LAB — CBC WITH DIFFERENTIAL/PLATELET
BASOS PCT: 1 % (ref 0–1)
Basophils Absolute: 0.1 10*3/uL (ref 0.0–0.1)
EOS ABS: 0.1 10*3/uL (ref 0.0–0.7)
EOS PCT: 1 % (ref 0–5)
HCT: 45.4 % (ref 36.0–46.0)
Hemoglobin: 15.7 g/dL — ABNORMAL HIGH (ref 12.0–15.0)
LYMPHS ABS: 1.8 10*3/uL (ref 0.7–4.0)
Lymphocytes Relative: 27 % (ref 12–46)
MCH: 28.8 pg (ref 26.0–34.0)
MCHC: 34.6 g/dL (ref 30.0–36.0)
MCV: 83.3 fL (ref 78.0–100.0)
Monocytes Absolute: 0.7 10*3/uL (ref 0.1–1.0)
Monocytes Relative: 10 % (ref 3–12)
NEUTROS PCT: 61 % (ref 43–77)
Neutro Abs: 4.1 10*3/uL (ref 1.7–7.7)
Platelets: 370 10*3/uL (ref 150–400)
RBC: 5.45 MIL/uL — AB (ref 3.87–5.11)
RDW: 13.8 % (ref 11.5–15.5)
WBC: 6.7 10*3/uL (ref 4.0–10.5)

## 2013-04-26 MED ORDER — LISINOPRIL 10 MG PO TABS: ORAL_TABLET | ORAL | Status: DC

## 2013-04-26 MED ORDER — TRIAMTERENE-HCTZ 37.5-25 MG PO TABS: 1.0000 | ORAL_TABLET | Freq: Every day | ORAL | Status: DC

## 2013-04-26 MED ORDER — NORETHINDRONE ACETATE 5 MG PO TABS: 5.0000 mg | ORAL_TABLET | Freq: Every day | ORAL | Status: DC
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
## 2013-04-27 ENCOUNTER — Telehealth: Payer: Self-pay

## 2013-04-27 ENCOUNTER — Other Ambulatory Visit: Payer: Self-pay | Admitting: *Deleted

## 2013-04-27 DIAGNOSIS — R7402 Elevation of levels of lactic acid dehydrogenase (LDH): Secondary | ICD-10-CM

## 2013-04-27 DIAGNOSIS — R740 Nonspecific elevation of levels of transaminase and lactic acid dehydrogenase [LDH]: Principal | ICD-10-CM

## 2013-04-27 DIAGNOSIS — R7401 Elevation of levels of liver transaminase levels: Secondary | ICD-10-CM

## 2013-04-27 LAB — COMPLETE METABOLIC PANEL WITH GFR
ALT: 64 U/L — AB (ref 0–35)
AST: 43 U/L — AB (ref 0–37)
Albumin: 4.1 g/dL (ref 3.5–5.2)
Alkaline Phosphatase: 69 U/L (ref 39–117)
BUN: 9 mg/dL (ref 6–23)
CALCIUM: 9.3 mg/dL (ref 8.4–10.5)
CHLORIDE: 100 meq/L (ref 96–112)
CO2: 23 mEq/L (ref 19–32)
Creat: 0.85 mg/dL (ref 0.50–1.10)
GFR, Est African American: >89 mL/min
GFR, Est Non African American: 80 mL/min
Glucose, Bld: 82 mg/dL (ref 70–99)
Potassium: 3.8 mEq/L (ref 3.5–5.3)
Sodium: 136 mEq/L (ref 135–145)
Total Bilirubin: 1.4 mg/dL — ABNORMAL HIGH (ref 0.2–1.2)
Total Protein: 7.5 g/dL (ref 6.0–8.3)

## 2013-04-27 LAB — HEPATITIS C ANTIBODY: HCV AB: NEGATIVE

## 2013-04-27 NOTE — Telephone Encounter (Signed)
PT STATES HER DAUGHTER TOLD HER SOMEONE CALLED AND NEEDED A CALL BACK.Marland Kitchen. PLEASE CALL [phone] WILL NOT BE AVAILABLE BETWEEN 2 AND 4

## 2013-04-28 NOTE — Telephone Encounter (Signed)
Pt was advised of lab report

## 2013-05-13 ENCOUNTER — Ambulatory Visit
Admission: RE | Admit: 2013-05-13 | Discharge: 2013-05-13 | Disposition: A | Discharge status: Home / Self Care | Payer: BC Managed Care – PPO | Source: Ambulatory Visit | Attending: Emergency Medicine | Admitting: Emergency Medicine

## 2013-05-13 DIAGNOSIS — R7401 Elevation of levels of liver transaminase levels: Secondary | ICD-10-CM

## 2013-05-13 DIAGNOSIS — R740 Nonspecific elevation of levels of transaminase and lactic acid dehydrogenase [LDH]: Principal | ICD-10-CM

## 2013-05-14 ENCOUNTER — Telehealth: Payer: Self-pay

## 2013-05-14 NOTE — Telephone Encounter (Signed)
Patient calling back for lab results she received a missed call today from someone clinical  Please call cell: [phone]

## 2013-05-15 NOTE — Telephone Encounter (Signed)
Rtn Call- see lab results.

## 2013-08-23 ENCOUNTER — Ambulatory Visit (INDEPENDENT_AMBULATORY_CARE_PROVIDER_SITE_OTHER): Payer: BC Managed Care – PPO | Admitting: Emergency Medicine

## 2013-08-23 ENCOUNTER — Other Ambulatory Visit: Payer: Self-pay | Admitting: Emergency Medicine

## 2013-08-23 ENCOUNTER — Encounter: Payer: Self-pay | Admitting: Radiology

## 2013-08-23 ENCOUNTER — Encounter: Payer: Self-pay | Admitting: Emergency Medicine

## 2013-08-23 VITALS — BP 116/70 | HR 89 | Temp 98.3°F | Resp 16 | Ht 63.75 in | Wt 182.2 lb

## 2013-08-23 DIAGNOSIS — R87620 Atypical squamous cells of undetermined significance on cytologic smear of vagina (ASC-US): Secondary | ICD-10-CM

## 2013-08-23 DIAGNOSIS — E559 Vitamin D deficiency, unspecified: Secondary | ICD-10-CM

## 2013-08-23 DIAGNOSIS — Z0000 Encounter for general adult medical examination without abnormal findings: Secondary | ICD-10-CM

## 2013-08-23 DIAGNOSIS — I10 Essential (primary) hypertension: Secondary | ICD-10-CM

## 2013-08-23 DIAGNOSIS — N86 Erosion and ectropion of cervix uteri: Secondary | ICD-10-CM

## 2013-08-23 LAB — CBC WITH DIFFERENTIAL/PLATELET
BASOS PCT: 0 % (ref 0–1)
Basophils Absolute: 0 10*3/uL (ref 0.0–0.1)
Eosinophils Absolute: 0.1 10*3/uL (ref 0.0–0.7)
Eosinophils Relative: 1 % (ref 0–5)
HEMATOCRIT: 43.9 % (ref 36.0–46.0)
Hemoglobin: 15.4 g/dL — ABNORMAL HIGH (ref 12.0–15.0)
LYMPHS ABS: 1.2 10*3/uL (ref 0.7–4.0)
Lymphocytes Relative: 18 % (ref 12–46)
MCH: 28.8 pg (ref 26.0–34.0)
MCHC: 35.1 g/dL (ref 30.0–36.0)
MCV: 82.2 fL (ref 78.0–100.0)
MONOS PCT: 10 % (ref 3–12)
Monocytes Absolute: 0.7 10*3/uL (ref 0.1–1.0)
NEUTROS ABS: 4.6 10*3/uL (ref 1.7–7.7)
Neutrophils Relative %: 71 % (ref 43–77)
Platelets: 336 10*3/uL (ref 150–400)
RBC: 5.34 MIL/uL — ABNORMAL HIGH (ref 3.87–5.11)
RDW: 13.7 % (ref 11.5–15.5)
WBC: 6.5 10*3/uL (ref 4.0–10.5)

## 2013-08-23 LAB — COMPLETE METABOLIC PANEL WITH GFR
ALBUMIN: 4.2 g/dL (ref 3.5–5.2)
ALK PHOS: 80 U/L (ref 39–117)
ALT: 43 U/L — ABNORMAL HIGH (ref 0–35)
AST: 31 U/L (ref 0–37)
BUN: 9 mg/dL (ref 6–23)
CALCIUM: 9.3 mg/dL (ref 8.4–10.5)
CO2: 26 meq/L (ref 19–32)
Chloride: 98 mEq/L (ref 96–112)
Creat: 0.92 mg/dL (ref 0.50–1.10)
GFR, EST AFRICAN AMERICAN: 83 mL/min
GFR, EST NON AFRICAN AMERICAN: 72 mL/min
GLUCOSE: 106 mg/dL — AB (ref 70–99)
POTASSIUM: 4 meq/L (ref 3.5–5.3)
Sodium: 133 mEq/L — ABNORMAL LOW (ref 135–145)
Total Bilirubin: 1.7 mg/dL — ABNORMAL HIGH (ref 0.2–1.2)
Total Protein: 6.9 g/dL (ref 6.0–8.3)

## 2013-08-23 LAB — POCT URINALYSIS DIPSTICK
BILIRUBIN UA: NEGATIVE
Blood, UA: NEGATIVE
GLUCOSE UA: NEGATIVE
KETONES UA: NEGATIVE
Leukocytes, UA: NEGATIVE
Nitrite, UA: NEGATIVE
Protein, UA: NEGATIVE
Spec Grav, UA: 1.01
Urobilinogen, UA: 0.2
pH, UA: 7

## 2013-08-23 LAB — LIPID PANEL
Cholesterol: 171 mg/dL (ref 0–200)
HDL: 29 mg/dL — AB (ref >39)
LDL CALC: 102 mg/dL — AB (ref 0–99)
Total CHOL/HDL Ratio: 5.9 Ratio
Triglycerides: 200 mg/dL — ABNORMAL HIGH (ref <150)
VLDL: 40 mg/dL (ref 0–40)

## 2013-08-23 LAB — IFOBT (OCCULT BLOOD): IFOBT: NEGATIVE

## 2013-08-23 MED ORDER — NORETHINDRONE ACETATE 5 MG PO TABS: 5.0000 mg | ORAL_TABLET | Freq: Every day | ORAL | Status: DC

## 2013-08-23 MED ORDER — TRIAMTERENE-HCTZ 37.5-25 MG PO TABS: ORAL_TABLET | ORAL | Status: DC

## 2013-08-23 MED ORDER — LISINOPRIL 10 MG PO TABS: ORAL_TABLET | ORAL | Status: DC

## 2013-08-23 NOTE — Progress Notes (Signed)
@[email or web address] @  Patient ID: Theresa Bell MRN: [id number], DOB: 02-10-1961, 53 y.o. Date of Encounter: 08/23/2013, 1:51 PM  Primary Physician: Lucilla EdinAUB, STEVE A, MD  Chief Complaint: Physical (CPE)  HPI: 53 y.o. y/o female with history of noted below here for CPE.  Doing well. No issues/complaints.  No LMP recorded. Patient is not currently having periods (Reason: Other). Pap:12/13 MMG: UTD Review of Systems:  Consitutional: No fever, chills, fatigue, night sweats, lymphadenopathy, or weight changes. Eyes: No visual changes, eye redness, or discharge. ENT/Mouth: Ears: No otalgia, tinnitus, hearing loss, discharge. Nose: No congestion, rhinorrhea, sinus pain, or epistaxis. Throat: No sore throat, post nasal drip, or teeth pain. Cardiovascular: No CP, palpitations, diaphoresis, DOE, edema, orthopnea, PND. Respiratory: No shortness of breath, no wheezing, no cough. Gastrointestinal: No anorexia, dysphagia, reflux, pain, nausea, vomiting, hematemesis, diarrhea, constipation, BRBPR, or melena. Breast: No discharge, pain, swelling, or mass. Genitourinary: No dysuria, frequency, urgency, hematuria, incontinence, nocturia, amenorrhea, vaginal discharge, pruritis, burning, abnormal bleeding, or pain. Musculoskeletal: No decreased ROM, myalgias, stiffness, joint swelling, or weakness. Skin: No rash, erythema, lesion changes, pain, warmth, jaundice, or pruritis. Neurological: No headache, dizziness, syncope, seizures, tremors, memory loss, coordination problems, or paresthesias. Psychological: No anxiety, depression, hallucinations, SI/HI. Endocrine: No fatigue, polydipsia, polyphagia, polyuria, or known diabetes. All other systems were reviewed and are otherwise negative.  Past Medical History  Diagnosis Date  . Hypertension   . Back pain   . Allergy      Past Surgical History  Procedure Laterality Date  . Tubal ligation    . Cholecystectomy    . Colonoscopy    . Polypectomy    .  Temporomandibular joint arthroscopy      Home Meds:  Prior to Admission medications   Medication Sig Start Date End Date Taking? Authorizing Provider  lisinopril (PRINIVIL,ZESTRIL) 10 MG tablet TAKE 1 TABLET BY MOUTH EVERY DAY 04/26/13  Yes Collene GobbleSteven A Daub, MD  norethindrone (AYGESTIN) 5 MG tablet Take 1 tablet (5 mg total) by mouth daily. 04/26/13  Yes Collene GobbleSteven A Daub, MD  triamterene-hydrochlorothiazide (MAXZIDE-25) 37.5-25 MG per tablet Take 1 tablet by mouth daily. 04/26/13  Yes Collene GobbleSteven A Daub, MD  Ergocalciferol (VITAMIN D2 PO) Take 1.25 mg by mouth daily.    Historical Provider, MD  Vitamin D, Ergocalciferol, (DRISDOL) 50000 UNITS CAPS Take 1 capsule (50,000 Units total) by mouth every 7 (seven) days. 04/26/11   Collene GobbleSteven A Daub, MD    Allergies:  Allergies  Allergen Reactions  . Codeine     REACTION: Nausea  . Darvocet [Propoxyphene N-Acetaminophen]   . Darvon     History   Social History  . Marital Status: Married    Spouse Name: N/A    Number of Children: N/A  . Years of Education: N/A   Occupational History  . Not on file.   Social History Main Topics  . Smoking status: Former Smoker    Types: Cigarettes    Start date: 11/10/1982  . Smokeless tobacco: Never Used  . Alcohol Use: 2.5 oz/week    5 drink(s) per week  . Drug Use: No  . Sexual Activity: Not on file   Other Topics Concern  . Not on file   Social History Narrative  . No narrative on file    Family History  Problem Relation Age of Onset  . Breast cancer Paternal Aunt   . Uterine cancer Maternal Grandmother   . Colon cancer Paternal Grandmother     Physical Exam  Blood pressure 116/70,  pulse 89, temperature 98.3 F (36.8 C), temperature source Oral, resp. rate 16, height 5' 3.75" (1.619 m), weight 182 lb 3.2 oz (82.645 kg), SpO2 98.00%., Body mass index is 31.53 kg/(m^2). General: Well developed, well nourished, in no acute distress. HEENT: Normocephalic, atraumatic. Conjunctiva pink, sclera non-icteric.  Pupils 2 mm constricting to 1 mm, round, regular, and equally reactive to light and accomodation. EOMI. Internal auditory canal clear. TMs with good cone of light and without pathology. Nasal mucosa pink. Nares are without discharge. No sinus tenderness. Oral mucosa pink. Dentition. Pharynx without exudate.   Neck: Supple. Trachea midline. No thyromegaly. Full ROM. No lymphadenopathy. Lungs: Clear to auscultation bilaterally without wheezes, rales, or rhonchi. Breathing is of normal effort and unlabored. Cardiovascular: RRR with S1 S2. No murmurs, rubs, or gallops appreciated. Distal pulses 2+ symmetrically. No carotid or abdominal bruits. Breast: There are no masses felt on breast exam no axillary masses  Abdomen: Soft, non-tender, non-distended with normoactive bowel sounds. No hepatosplenomegaly or masses. No rebound/guarding. No CVA tenderness. Without hernias.  Genitourinary:  There is friable tissue present superior portion of the cervical os. Pap smear was done of this area as well as in the cervical. This area bleeds freely to   Musculoskeletal: Full range of motion and 5/5 strength throughout. Without swelling, atrophy, tenderness, crepitus, or warmth. Extremities without clubbing, cyanosis, or edema. Calves supple. Skin: Warm and moist without erythema, ecchymosis, wounds, or rash. Neuro: A+Ox3. CN II-XII grossly intact. Moves all extremities spontaneously. Full sensation throughout. Normal gait. DTR 2+ throughout upper and lower extremities. Finger to nose intact. Psych:  Responds to questions appropriately with a normal affect.        Assessment/Plan:  53 y.o. y/o female here for CPE. Blood pressure is under good control. Question of how long she should stay on progesterone to I will need to further research this. She had a recent ultrasound which showed a lesion in her liver to be stable. She is scheduled for an upcoming mammogram. She apparently is up-to-date on her colonoscopies.  I think it would be nice she may be able to come down off of her blood pressure medications due to her exceedingly excellent blood pressure and should consider coming off of her hormones. Followup appointment 6 months  -  Signed, Earl LitesSteve Daub, MD 08/23/2013 1:51 PM

## 2013-08-24 LAB — PAP IG (IMAGE GUIDED)

## 2013-08-24 LAB — TSH: TSH: 3.108 u[IU]/mL (ref 0.350–4.500)

## 2013-08-24 LAB — VITAMIN D 25 HYDROXY (VIT D DEFICIENCY, FRACTURES): VIT D 25 HYDROXY: 47 ng/mL (ref 30–89)

## 2013-08-25 ENCOUNTER — Other Ambulatory Visit: Payer: Self-pay | Admitting: Emergency Medicine

## 2013-08-25 DIAGNOSIS — R87610 Atypical squamous cells of undetermined significance on cytologic smear of cervix (ASC-US): Secondary | ICD-10-CM

## 2013-08-25 LAB — BILIRUBIN, FRACTIONATED(TOT/DIR/INDIR)
BILIRUBIN INDIRECT: 1.5 mg/dL — AB (ref 0.2–1.2)
Bilirubin, Direct: 0.2 mg/dL (ref 0.0–0.3)
Total Bilirubin: 1.7 mg/dL — ABNORMAL HIGH (ref 0.2–1.2)

## 2013-08-26 ENCOUNTER — Telehealth: Payer: Self-pay

## 2013-08-26 LAB — HUMAN PAPILLOMAVIRUS, HIGH RISK: HPV DNA HIGH RISK: NOT DETECTED

## 2013-08-26 NOTE — Telephone Encounter (Signed)
Pt is needing a order to [facility] for a bone density

## 2013-08-28 NOTE — Addendum Note (Signed)
Addended by: Johnnette LitterARDWELL, Lynnie Koehler M on: 08/28/2013 08:49 AM   Modules accepted: Orders

## 2013-08-28 NOTE — Telephone Encounter (Signed)
Can you send order to [facility]?

## 2013-08-29 NOTE — Telephone Encounter (Signed)
Referrals sent this order to [facility]

## 2013-09-30 LAB — HM DEXA SCAN

## 2013-10-11 ENCOUNTER — Telehealth: Payer: Self-pay | Admitting: Family Medicine

## 2013-10-11 NOTE — Telephone Encounter (Signed)
Spoke with patient advised her to take Vit D 1000 Calium 122 or Vit D and 2 tums per Dr Cleta Albertsaub and also gave her results of Bone density test and to redo test in 2 more years per Dr Cleta Albertsaub

## 2013-10-17 ENCOUNTER — Encounter: Payer: Self-pay | Admitting: Emergency Medicine

## 2013-10-21 ENCOUNTER — Encounter: Payer: Self-pay | Admitting: Radiology

## 2013-10-25 ENCOUNTER — Encounter: Payer: Self-pay | Admitting: Emergency Medicine

## 2014-01-16 ENCOUNTER — Ambulatory Visit (INDEPENDENT_AMBULATORY_CARE_PROVIDER_SITE_OTHER): Payer: BC Managed Care – PPO | Admitting: Emergency Medicine

## 2014-01-16 VITALS — BP 118/68 | HR 99 | Temp 98.3°F | Resp 18 | Ht 63.75 in | Wt 178.0 lb

## 2014-01-16 DIAGNOSIS — J0100 Acute maxillary sinusitis, unspecified: Secondary | ICD-10-CM

## 2014-01-16 DIAGNOSIS — J440 Chronic obstructive pulmonary disease with acute lower respiratory infection: Secondary | ICD-10-CM

## 2014-01-16 MED ORDER — ALBUTEROL SULFATE HFA 108 (90 BASE) MCG/ACT IN AERS: 2.0000 | INHALATION_SPRAY | RESPIRATORY_TRACT | Status: DC | PRN

## 2014-01-16 MED ORDER — AMOXICILLIN-POT CLAVULANATE 875-125 MG PO TABS: 1.0000 | ORAL_TABLET | Freq: Two times a day (BID) | ORAL | Status: DC

## 2014-01-16 NOTE — Patient Instructions (Signed)
Metered Dose Inhaler (No Spacer Used)  Inhaled medicines are the basis of treatment for asthma and other breathing problems. Inhaled medicine can only be effective if used properly. Good technique assures that the medicine reaches the lungs.  Metered dose inhalers (MDIs) are used to deliver a variety of inhaled medicines. These include quick relief or rescue medicines (such as bronchodilators) and controller medicines (such as corticosteroids). The medicine is delivered by pushing down on a metal canister to release a set amount of spray.  If you are using different kinds of inhalers, use your quick relief medicine to open the airways 10-15 minutes before using a steroid, if instructed to do so by your health care provider. If you are unsure which inhalers to use and the order of using them, ask your health care provider, nurse, or respiratory therapist.  HOW TO USE THE INHALER  1. Remove the cap from the inhaler.  2. If you are using the inhaler for the first time, you will need to prime it. Shake the inhaler for 5 seconds and release four puffs into the air, away from your face. Ask your health care provider or pharmacist if you have questions about priming your inhaler.  3. Shake the inhaler for 5 seconds before each breath in (inhalation).  4. Position the inhaler so that the top of the canister faces up.  5. Put your index finger on the top of the medicine canister. Your thumb supports the bottom of the inhaler.  6. Open your mouth.  7. Either place the inhaler between your teeth and place your lips tightly around the mouthpiece, or hold the inhaler 1-2 inches away from your open mouth. If you are unsure of which technique to use, ask your health care provider.  8. Breathe out (exhale) normally and as completely as possible.  9. Press the canister down with the index finger to release the medicine.  10. At the same time as the canister is pressed, inhale deeply and slowly until your lungs are completely filled.  This should take 4-6 seconds. Keep your tongue down.  11. Hold the medicine in your lungs for 5-10 seconds (10 seconds is best). This helps the medicine get into the small airways of your lungs.  12. Breathe out slowly, through pursed lips. Whistling is an example of pursed lips.  13. Wait at least 1 minute between puffs. Continue with the above steps until you have taken the number of puffs your health care provider has ordered. Do not use the inhaler more than your health care provider directs you to.  14. Replace the cap on the inhaler.  15. Follow the directions from your health care provider or the inhaler insert for cleaning the inhaler.  If you are using a steroid inhaler, after your last puff, rinse your mouth with water, gargle, and spit out the water. Do not swallow the water.  AVOID:  · Inhaling before or after starting the spray of medicine. It takes practice to coordinate your breathing with triggering the spray.  · Inhaling through the nose (rather than the mouth) when triggering the spray.  HOW TO DETERMINE IF YOUR INHALER IS FULL OR NEARLY EMPTY  You cannot know when an inhaler is empty by shaking it. Some inhalers are now being made with dose counters. Ask your health care provider for a prescription that has a dose counter if you feel you need that extra help. If your inhaler does not have a counter, ask your health care   provider to help you determine the date you need to refill your inhaler. Write the refill date on a calendar or your inhaler canister. Refill your inhaler 7-10 days before it runs out. Be sure to keep an adequate supply of medicine. This includes making sure it has not expired, and making sure you have a spare inhaler.  SEEK MEDICAL CARE IF:  · Symptoms are only partially relieved with your inhaler.  · You are having trouble using your inhaler.  · You experience an increase in phlegm.  SEEK IMMEDIATE MEDICAL CARE IF:  · You feel little or no relief with your inhalers. You are still  wheezing and feeling shortness of breath, tightness in your chest, or both.  · You have dizziness, headaches, or a fast heart rate.  · You have chills, fever, or night sweats.  · There is a noticeable increase in phlegm production, or there is blood in the phlegm.  MAKE SURE YOU:  · Understand these instructions.  · Will watch your condition.  · Will get help right away if you are not doing well or get worse.  Document Released: 12/08/2006 Document Revised: 06/27/2013 Document Reviewed: 07/29/2012  ExitCare® Patient Information ©2015 [employer], LLC. This information is not intended to replace advice given to you by your health care provider. Make sure you discuss any questions you have with your health care provider.

## 2014-01-16 NOTE — Progress Notes (Signed)
[facility] [address], [place] [id number] [phone]  Date:  01/16/2014   Name:  Theresa Bell   DOB:  Jul 06, 1960   MRN:  [id number]  PCP:  Lucilla EdinAUB, STEVE A, MD    Chief Complaint: head congestion; itchy throat; and Headache   History of Present Illness:  Theresa Bell is a 53 y.o. very pleasant female patient who presents with the following:  Ill since last mid-week.  Has nasal congestion and frontal and cheek pressure. Post nasal drainage. No sore throat. Has a cough that is not productive.  No wheezing or shortness of breath. No fever or chills. No improvement with over the counter medications or other home remedies. Denies other complaint or health concern today.   Patient Active Problem List   Diagnosis Date Noted  . Erosion and ectropion of cervix 08/23/2013  . Hypertension 04/26/2011  . Unspecified vitamin D deficiency 04/26/2011  . Hormone replacement therapy (postmenopausal) 04/26/2011  . TRANSAMINASES, SERUM, ELEVATED 02/12/2009  . ABNORMAL FINDINGS GI TRACT 02/12/2009    Past Medical History  Diagnosis Date  . Hypertension   . Back pain   . Allergy   . Blood transfusion without reported diagnosis     Past Surgical History  Procedure Laterality Date  . Tubal ligation    . Cholecystectomy    . Colonoscopy    . Polypectomy    . Temporomandibular joint arthroscopy      History  Substance Use Topics  . Smoking status: Former Smoker    Types: Cigarettes    Start date: 11/10/1982  . Smokeless tobacco: Never Used  . Alcohol Use: 2.5 oz/week    5 drink(s) per week    Family History  Problem Relation Age of Onset  . Breast cancer Paternal Aunt   . Uterine cancer Maternal Grandmother   . Colon cancer Paternal Grandmother   . Cancer Father     lung  . Hypertension Father   . Hyperlipidemia Father     Allergies  Allergen Reactions  . Codeine     REACTION: Nausea  . Darvocet [Propoxyphene N-Acetaminophen]   .  Darvon     Medication list has been reviewed and updated.  Current Outpatient Prescriptions on File Prior to Visit  Medication Sig Dispense Refill  . Ergocalciferol (VITAMIN D2 PO) Take 1.25 mg by mouth daily.    Marland Kitchen. lisinopril (PRINIVIL,ZESTRIL) 10 MG tablet TAKE 1 TABLET BY MOUTH EVERY DAY 30 tablet 11  . triamterene-hydrochlorothiazide (MAXZIDE-25) 37.5-25 MG per tablet Take one half tablet daily 30 tablet 11   No current facility-administered medications on file prior to visit.    Review of Systems:  As per HPI, otherwise negative.    Physical Examination: Filed Vitals:   01/16/14 1318  BP: 118/68  Pulse: 99  Temp: 98.3 F (36.8 C)  Resp: 18   Filed Vitals:   01/16/14 1318  Height: 5' 3.75" (1.619 m)  Weight: 178 lb (80.74 kg)   Body mass index is 30.8 kg/(m^2). Ideal Body Weight: Weight in (lb) to have BMI = 25: 144.2  GEN: WDWN, NAD, Non-toxic, A & O x 3 HEENT: Atraumatic, Normocephalic. Neck supple. No masses, No LAD. Ears and Nose: No external deformity. CV: RRR, No M/G/R. No JVD. No thrill. No extra heart sounds. PULM: scattered mild wheezes, crackles, rhonchi. No retractions. No resp. distress. No accessory muscle use. ABD: S, NT, ND, +BS. No rebound. No HSM. EXTR: No c/c/e NEURO Normal gait.  PSYCH: Normally interactive. Conversant. Not depressed or anxious appearing.  Calm demeanor.    Assessment and Plan: Sinusitis Bronchitis with bronchospasm Albuterol augmentin  Signed,  Phillips OdorJeffery Adler Chartrand, MD

## 2014-02-21 ENCOUNTER — Ambulatory Visit (INDEPENDENT_AMBULATORY_CARE_PROVIDER_SITE_OTHER): Payer: BC Managed Care – PPO | Admitting: Emergency Medicine

## 2014-02-21 ENCOUNTER — Ambulatory Visit (INDEPENDENT_AMBULATORY_CARE_PROVIDER_SITE_OTHER): Payer: BC Managed Care – PPO

## 2014-02-21 VITALS — BP 122/78 | HR 102 | Temp 98.5°F | Resp 16 | Ht 64.0 in | Wt 175.0 lb

## 2014-02-21 DIAGNOSIS — J029 Acute pharyngitis, unspecified: Secondary | ICD-10-CM

## 2014-02-21 DIAGNOSIS — D72829 Elevated white blood cell count, unspecified: Secondary | ICD-10-CM

## 2014-02-21 LAB — POCT CBC
GRANULOCYTE PERCENT: 89.3 % — AB (ref 37–80)
HCT, POC: 42.7 % (ref 37.7–47.9)
Hemoglobin: 14.6 g/dL (ref 12.2–16.2)
LYMPH, POC: 1.4 (ref 0.6–3.4)
MCH, POC: 29.8 pg (ref 27–31.2)
MCHC: 34.3 g/dL (ref 31.8–35.4)
MCV: 87 fL (ref 80–97)
MID (CBC): 0.2 (ref 0–0.9)
MPV: 7.5 fL (ref 0–99.8)
POC Granulocyte: 13.5 — AB (ref 2–6.9)
POC LYMPH PERCENT: 9.1 %L — AB (ref 10–50)
POC MID %: 1.6 % (ref 0–12)
Platelet Count, POC: 302 10*3/uL (ref 142–424)
RBC: 4.91 M/uL (ref 4.04–5.48)
RDW, POC: 12.9 %
WBC: 15.1 10*3/uL — AB (ref 4.6–10.2)

## 2014-02-21 LAB — BASIC METABOLIC PANEL
BUN: 7 mg/dL (ref 6–23)
CHLORIDE: 99 meq/L (ref 96–112)
CO2: 27 mEq/L (ref 19–32)
Calcium: 8.9 mg/dL (ref 8.4–10.5)
Creat: 0.77 mg/dL (ref 0.50–1.10)
Glucose, Bld: 99 mg/dL (ref 70–99)
Potassium: 3.7 mEq/L (ref 3.5–5.3)
Sodium: 135 mEq/L (ref 135–145)

## 2014-02-21 LAB — POCT INFLUENZA A/B
INFLUENZA A, POC: NEGATIVE
Influenza B, POC: NEGATIVE

## 2014-02-21 LAB — LIPID PANEL
Cholesterol: 152 mg/dL (ref 0–200)
HDL: 38 mg/dL — AB (ref >39)
LDL Cholesterol: 83 mg/dL (ref 0–99)
Total CHOL/HDL Ratio: 4 Ratio
Triglycerides: 154 mg/dL — ABNORMAL HIGH (ref <150)
VLDL: 31 mg/dL (ref 0–40)

## 2014-02-21 LAB — POCT RAPID STREP A (OFFICE): Rapid Strep A Screen: NEGATIVE

## 2014-02-21 MED ORDER — CEFTRIAXONE SODIUM 1 G IJ SOLR
1.0000 g | Freq: Once | INTRAMUSCULAR | Status: AC
  Administered 2014-02-21: 1 g via INTRAMUSCULAR

## 2014-02-21 MED ORDER — LEVOFLOXACIN 750 MG PO TABS: 750.0000 mg | ORAL_TABLET | Freq: Every day | ORAL | Status: DC

## 2014-02-21 MED ORDER — LISINOPRIL 10 MG PO TABS: ORAL_TABLET | ORAL | Status: DC

## 2014-02-21 MED ORDER — LEVOFLOXACIN 500 MG PO TABS: 500.0000 mg | ORAL_TABLET | Freq: Every day | ORAL | Status: AC

## 2014-02-21 MED ORDER — TRIAMTERENE-HCTZ 37.5-25 MG PO TABS: ORAL_TABLET | ORAL | Status: DC

## 2014-02-21 NOTE — Patient Instructions (Signed)

## 2014-02-21 NOTE — Progress Notes (Addendum)
Subjective:    Patient ID: Theresa Bell, female    DOB: 21-Feb-1961, 53 y.o.   MRN: [id number] This chart was scribed for Lesle ChrisSteven Lewin Pellow, MD by Littie Deedsichard Sun, Medical Scribe. This patient was seen in room 21 and the patient's care was started at 8:14 AM.   HPI HPI Comments: Theresa Bell is a 53 y.o. female with a hx of HTN who [facility].   Patient reports having gradual onset URI symptoms that began 4 days ago. She states that she started having a HA 4 days ago, and then started having chills 2 days ago. Yesterday, the patient woke up with a sore throat, mild rhinorrhea with clear drainage, a productive cough with green/yellow sputum, and her ears feel stopped up. She states that nobody is sick at work, but her daughter along with other family members are starting to get sick. Patient states she did go to work yesterday, but she slept early around 8:00 PM last night. She was sick last month; she was put on Augmentin, but she did not finish the course because the medication made her feel sick to the stomach. She notes having allergies to pain pills.  Review of Systems  Constitutional: Positive for chills.  HENT: Positive for congestion and rhinorrhea.   Respiratory: Positive for cough.   Neurological: Positive for headaches.       Objective:   Physical Exam CONSTITUTIONAL: Ill-appearing, non-toxic. HEAD: Normocephalic/atraumatic EYES: EOM/PERRL ENMT: Mucous membranes moist. Significant nasal congestion. NECK: supple no meningeal signs SPINE: entire spine nontender CV: S1/S2 noted, no murmurs/rubs/gallops noted. Repeat BP: 116/80 LUNGS: Upper airway rhonchi, no wheezes. Breath sounds are symmetrical. ABDOMEN: soft, nontender, no rebound or guarding GU: no cva tenderness NEURO: Pt is awake/alert, moves all extremitiesx4 EXTREMITIES: pulses normal, full ROM SKIN: warm, color normal PSYCH: no abnormalities of mood noted Results  for orders placed or performed in visit on 02/21/14  POCT CBC  Result Value Ref Range   WBC 15.1 (A) 4.6 - 10.2 K/uL   Lymph, poc 1.4 0.6 - 3.4   POC LYMPH PERCENT 9.1 (A) 10 - 50 %L   MID (cbc) 0.2 0 - 0.9   POC MID % 1.6 0 - 12 %M   POC Granulocyte 13.5 (A) 2 - 6.9   Granulocyte percent 89.3 (A) 37 - 80 %G   RBC 4.91 4.04 - 5.48 M/uL   Hemoglobin 14.6 12.2 - 16.2 g/dL   HCT, POC 91.442.7 78.237.7 - 47.9 %   MCV 87.0 80 - 97 fL   MCH, POC 29.8 27 - 31.2 pg   MCHC 34.3 31.8 - 35.4 g/dL   RDW, POC 95.612.9 %   Platelet Count, POC 302 142 - 424 K/uL   MPV 7.5 0 - 99.8 fL  POCT Influenza A/B  Result Value Ref Range   Influenza A, POC Negative    Influenza B, POC Negative   POCT rapid strep A  Result Value Ref Range   Rapid Strep A Screen Negative Negative  [facility] reading (PRIMARY) by  Dr.Carr Shartzer there is a lingular infiltrate noted. There also appears to be some increased markings right lower lobe.      Assessment & Plan:  Patient has a lingular infiltrate consistent with pneumonia. We'll treat with Rocephin and Levaquin recheck 24 hours.I personally performed the services described in this documentation, which was scribed in my presence. The recorded information has been reviewed and is accurate.I personally performed  the services described in this documentation, which was scribed in my presence. The recorded information has been reviewed and is accurate.

## 2014-02-22 ENCOUNTER — Ambulatory Visit (INDEPENDENT_AMBULATORY_CARE_PROVIDER_SITE_OTHER): Payer: BC Managed Care – PPO | Admitting: Emergency Medicine

## 2014-02-22 VITALS — BP 134/84 | HR 109 | Temp 98.1°F | Resp 18 | Ht 64.0 in | Wt 174.2 lb

## 2014-02-22 DIAGNOSIS — R05 Cough: Secondary | ICD-10-CM

## 2014-02-22 DIAGNOSIS — J029 Acute pharyngitis, unspecified: Secondary | ICD-10-CM

## 2014-02-22 DIAGNOSIS — R059 Cough, unspecified: Secondary | ICD-10-CM

## 2014-02-22 LAB — POCT CBC
GRANULOCYTE PERCENT: 79.6 % (ref 37–80)
HCT, POC: 46.4 % (ref 37.7–47.9)
Hemoglobin: 15.5 g/dL (ref 12.2–16.2)
LYMPH, POC: 1.6 (ref 0.6–3.4)
MCH, POC: 29.3 pg (ref 27–31.2)
MCHC: 33.4 g/dL (ref 31.8–35.4)
MCV: 87.5 fL (ref 80–97)
MID (cbc): 0.5 (ref 0–0.9)
MPV: 7.3 fL (ref 0–99.8)
POC Granulocyte: 8.5 — AB (ref 2–6.9)
POC LYMPH PERCENT: 15.4 %L (ref 10–50)
POC MID %: 5 % (ref 0–12)
Platelet Count, POC: 313 10*3/uL (ref 142–424)
RBC: 5.3 M/uL (ref 4.04–5.48)
RDW, POC: 12.8 %
WBC: 10.7 10*3/uL — AB (ref 4.6–10.2)

## 2014-02-22 MED ORDER — BENZONATATE 100 MG PO CAPS: 100.0000 mg | ORAL_CAPSULE | Freq: Three times a day (TID) | ORAL | Status: DC | PRN

## 2014-02-22 MED ORDER — HYDROCODONE-HOMATROPINE 5-1.5 MG/5ML PO SYRP: 5.0000 mL | ORAL_SOLUTION | Freq: Three times a day (TID) | ORAL | Status: DC | PRN

## 2014-02-22 NOTE — Patient Instructions (Signed)
Please hold your  lisinopril.Cough, Adult  A cough is a reflex that helps clear your throat and airways. It can help heal the body or may be a reaction to an irritated airway. A cough may only last 2 or 3 weeks (acute) or may last more than 8 weeks (chronic).  CAUSES Acute cough:  Viral or bacterial infections. Chronic cough:  Infections.  Allergies.  Asthma.  Post-nasal drip.  Smoking.  Heartburn or acid reflux.  Some medicines.  Chronic lung problems (COPD).  Cancer. SYMPTOMS   Cough.  Fever.  Chest pain.  Increased breathing rate.  High-pitched whistling sound when breathing (wheezing).  Colored mucus that you cough up (sputum). TREATMENT   A bacterial cough may be treated with antibiotic medicine.  A viral cough must run its course and will not respond to antibiotics.  Your caregiver may recommend other treatments if you have a chronic cough. HOME CARE INSTRUCTIONS   Only take over-the-counter or prescription medicines for pain, discomfort, or fever as directed by your caregiver. Use cough suppressants only as directed by your caregiver.  Use a cold steam vaporizer or humidifier in your bedroom or home to help loosen secretions.  Sleep in a semi-upright position if your cough is worse at night.  Rest as needed.  Stop smoking if you smoke. SEEK IMMEDIATE MEDICAL CARE IF:   You have pus in your sputum.  Your cough starts to worsen.  You cannot control your cough with suppressants and are losing sleep.  You begin coughing up blood.  You have difficulty breathing.  You develop pain which is getting worse or is uncontrolled with medicine.  You have a fever. MAKE SURE YOU:   Understand these instructions.  Will watch your condition.  Will get help right away if you are not doing well or get worse. Document Released: 08/09/2010 Document Revised: 05/05/2011 Document Reviewed: 08/09/2010 [facility] Patient Information 2015 [employer], [place]. This  information is not intended to replace advice given to you by your health care provider. Make sure you discuss any questions you have with your health care provider. .Marland Kitchen

## 2014-02-22 NOTE — Progress Notes (Addendum)
   Subjective:    Patient ID: Theresa Bell, female    DOB: 1960/09/07, 53 y.o.   MRN: [id number]  This chart was scribed for Lucilla EdinSteve A Hiilani Jetter, MD by Ronney LionSuzanne Le, ED Scribe. This patient was seen in room 14 and the patient's care was started at 11:39 AM.   Chief Complaint  Patient presents with  . Follow-up  . Sore Throat    pt states she is not feeling any better since yesterday  . Chest Pain    chest hurts from coughing     HPI   HPI Comments: Theresa MouldsGayle C Hurless is a 53 y.o. female who [facility] for a follow-up visit after seeing me yesterday with respiratory illness, severe cough, elevated white count of 15,000 with chest XR not showing a definite consolidated pneumonia. She was started on Levaquin 500 QD.  Patient complains of constant chest pain from coughing and constant, frequent cough that is moderate in severity. She has had sick contact with Florentina AddisonKatie, who has similar symptoms. Patient denies fever. She has known allergies to several medications. She states she slept all day yesterday.  Patient states she hasn't eaten   Review of Systems  Constitutional: Negative for fever.  HENT: Positive for sore throat.   Respiratory: Positive for cough.   Cardiovascular: Positive for chest pain.       Objective:   Physical Exam  Nursing note and vitals reviewed.  CONSTITUTIONAL: Well developed/well nourished. Ill, non-toxic. HEAD: Normocephalic/atraumatic EYES: EOMI/PERRL  ENMT: Mucous membranes moist clear rhinorrhea NECK: supple no meningeal signs SPINE/BACK:entire spine nontender CV: S1/S2 noted, no murmurs/rubs/gallops noted  LUNGS: No apparent distress. Breath sounds are symmetrical. No definite wheezes heard, no rales or dullness. ABDOMEN: soft, nontender, no rebound or guarding, bowel sounds noted throughout abdomen GU:no cva tenderness NEURO: Pt is awake/alert/appropriate, moves all extremitiesx4.  No facial droop.   EXTREMITIES: pulses  normal/equal, full ROM SKIN: warm, color normal PSYCH: no abnormalities of mood noted, alert and oriented to situation Results for orders placed or performed in visit on 02/22/14  POCT CBC  Result Value Ref Range   WBC 10.7 (A) 4.6 - 10.2 K/uL   Lymph, poc 1.6 0.6 - 3.4   POC LYMPH PERCENT 15.4 10 - 50 %L   MID (cbc) 0.5 0 - 0.9   POC MID % 5.0 0 - 12 %M   POC Granulocyte 8.5 (A) 2 - 6.9   Granulocyte percent 79.6 37 - 80 %G   RBC 5.30 4.04 - 5.48 M/uL   Hemoglobin 15.5 12.2 - 16.2 g/dL   HCT, POC 78.246.4 95.637.7 - 47.9 %   MCV 87.5 80 - 97 fL   MCH, POC 29.3 27 - 31.2 pg   MCHC 33.4 31.8 - 35.4 g/dL   RDW, POC 21.312.8 %   Platelet Count, POC 313 142 - 424 K/uL   MPV 7.3 0 - 99.8 fL        Assessment & Plan:  White count is down to 10 700. We'll continue antibiotics as instructed she was also given prescriptions for Occidental Petroleumessalon Perles and Hycodan.I personally performed the services described in this documentation, which was scribed in my presence. The recorded information has been reviewed and is accurate. Follow-up on Monday if not significantly better.

## 2014-03-21 ENCOUNTER — Encounter: Payer: Self-pay | Admitting: Emergency Medicine

## 2014-03-21 ENCOUNTER — Ambulatory Visit (INDEPENDENT_AMBULATORY_CARE_PROVIDER_SITE_OTHER): Payer: BLUE CROSS/BLUE SHIELD | Admitting: Emergency Medicine

## 2014-03-21 VITALS — BP 128/68 | HR 107 | Temp 98.1°F | Resp 16 | Wt 173.2 lb

## 2014-03-21 DIAGNOSIS — R05 Cough: Secondary | ICD-10-CM

## 2014-03-21 DIAGNOSIS — R9431 Abnormal electrocardiogram [ECG] [EKG]: Secondary | ICD-10-CM

## 2014-03-21 DIAGNOSIS — R059 Cough, unspecified: Secondary | ICD-10-CM

## 2014-03-21 DIAGNOSIS — Z9109 Other allergy status, other than to drugs and biological substances: Secondary | ICD-10-CM

## 2014-03-21 DIAGNOSIS — R079 Chest pain, unspecified: Secondary | ICD-10-CM

## 2014-03-21 DIAGNOSIS — I10 Essential (primary) hypertension: Secondary | ICD-10-CM

## 2014-03-21 DIAGNOSIS — Z91048 Other nonmedicinal substance allergy status: Secondary | ICD-10-CM

## 2014-03-21 MED ORDER — LOSARTAN POTASSIUM 25 MG PO TABS: 25.0000 mg | ORAL_TABLET | Freq: Every day | ORAL | Status: DC

## 2014-03-21 MED ORDER — CETIRIZINE HCL 10 MG PO TABS: 10.0000 mg | ORAL_TABLET | Freq: Every day | ORAL | Status: DC

## 2014-03-21 MED ORDER — PREDNISONE 20 MG PO TABS: ORAL_TABLET | ORAL | Status: DC

## 2014-03-21 NOTE — Patient Instructions (Signed)
Start taking losartan daily. Start daily zyrtec daily. Take prednisone until finished. Use albuterol as needed for shortness of breath/coughing/wheezing. Return if not getting better in 10-14 days.
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
## 2014-05-15 ENCOUNTER — Encounter: Payer: Self-pay | Admitting: Physician Assistant

## 2014-05-15 DIAGNOSIS — T7840XA Allergy, unspecified, initial encounter: Secondary | ICD-10-CM | POA: Insufficient documentation

## 2014-06-04 ENCOUNTER — Ambulatory Visit (INDEPENDENT_AMBULATORY_CARE_PROVIDER_SITE_OTHER): Payer: BLUE CROSS/BLUE SHIELD

## 2014-06-04 ENCOUNTER — Ambulatory Visit (INDEPENDENT_AMBULATORY_CARE_PROVIDER_SITE_OTHER): Payer: BLUE CROSS/BLUE SHIELD | Admitting: Family Medicine

## 2014-06-04 VITALS — BP 126/80 | HR 115 | Temp 98.8°F | Resp 17 | Ht 64.0 in | Wt 182.0 lb

## 2014-06-04 DIAGNOSIS — R059 Cough, unspecified: Secondary | ICD-10-CM

## 2014-06-04 DIAGNOSIS — J988 Other specified respiratory disorders: Secondary | ICD-10-CM

## 2014-06-04 DIAGNOSIS — J22 Unspecified acute lower respiratory infection: Secondary | ICD-10-CM

## 2014-06-04 DIAGNOSIS — R05 Cough: Secondary | ICD-10-CM

## 2014-06-04 LAB — POCT CBC
GRANULOCYTE PERCENT: 75.3 % (ref 37–80)
HCT, POC: 44.2 % (ref 37.7–47.9)
Hemoglobin: 14.8 g/dL (ref 12.2–16.2)
Lymph, poc: 1.4 (ref 0.6–3.4)
MCH, POC: 28.4 pg (ref 27–31.2)
MCHC: 33.5 g/dL (ref 31.8–35.4)
MCV: 84.7 fL (ref 80–97)
MID (cbc): 0.3 (ref 0–0.9)
MPV: 7.7 fL (ref 0–99.8)
PLATELET COUNT, POC: 315 10*3/uL (ref 142–424)
POC Granulocyte: 5.3 (ref 2–6.9)
POC LYMPH PERCENT: 19.9 %L (ref 10–50)
POC MID %: 4.8 % (ref 0–12)
RBC: 5.22 M/uL (ref 4.04–5.48)
RDW, POC: 12.8 %
WBC: 7 10*3/uL (ref 4.6–10.2)

## 2014-06-04 LAB — POCT INFLUENZA A/B
Influenza A, POC: NEGATIVE
Influenza B, POC: NEGATIVE

## 2014-06-04 MED ORDER — DOXYCYCLINE HYCLATE 100 MG PO TABS: 100.0000 mg | ORAL_TABLET | Freq: Two times a day (BID) | ORAL | Status: DC

## 2014-06-04 NOTE — Patient Instructions (Signed)
Saline nasal spray atleast 4 times per day if needed for nasal congestion, over the counter mucinex or mucinex DM (or tessalon) for cough. Drink plenty of fluids. If you do have wheezing, can use albuterol inhaler.  Return to clinic if worsening.  Cough, Adult  A cough is a reflex that helps clear your throat and airways. It can help heal the body or may be a reaction to an irritated airway. A cough may only last 2 or 3 weeks (acute) or may last more than 8 weeks (chronic).  CAUSES Acute cough:  Viral or bacterial infections. Chronic cough:  Infections.  Allergies.  Asthma.  Post-nasal drip.  Smoking.  Heartburn or acid reflux.  Some medicines.  Chronic lung problems (COPD).  Cancer. SYMPTOMS   Cough.  Fever.  Chest pain.  Increased breathing rate.  High-pitched whistling sound when breathing (wheezing).  Colored mucus that you cough up (sputum). TREATMENT   A bacterial cough may be treated with antibiotic medicine.  A viral cough must run its course and will not respond to antibiotics.  Your caregiver may recommend other treatments if you have a chronic cough. HOME CARE INSTRUCTIONS   Only take over-the-counter or prescription medicines for pain, discomfort, or fever as directed by your caregiver. Use cough suppressants only as directed by your caregiver.  Use a cold steam vaporizer or humidifier in your bedroom or home to help loosen secretions.  Sleep in a semi-upright position if your cough is worse at night.  Rest as needed.  Stop smoking if you smoke. SEEK IMMEDIATE MEDICAL CARE IF:   You have pus in your sputum.  Your cough starts to worsen.  You cannot control your cough with suppressants and are losing sleep.  You begin coughing up blood.  You have difficulty breathing.  You develop pain which is getting worse or is uncontrolled with medicine.  You have a fever. MAKE SURE YOU:   Understand these instructions.  Will watch your  condition.  Will get help right away if you are not doing well or get worse. Document Released: 08/09/2010 Document Revised: 05/05/2011 Document Reviewed: 08/09/2010 [facility] Patient Information 2015 [place], [place]. This information is not intended to replace advice given to you by your health care provider. Make sure you discuss any questions you have with your health care provider.  Acute Bronchitis Bronchitis is inflammation of the airways that extend from the windpipe into the lungs (bronchi). The inflammation often causes mucus to develop. This leads to a cough, which is the most common symptom of bronchitis.  In acute bronchitis, the condition usually develops suddenly and goes away over time, usually in a couple weeks. Smoking, allergies, and asthma can make bronchitis worse. Repeated episodes of bronchitis may cause further lung problems.  CAUSES Acute bronchitis is most often caused by the same virus that causes a cold. The virus can spread from person to person (contagious) through coughing, sneezing, and touching contaminated objects. SIGNS AND SYMPTOMS   Cough.   Fever.   Coughing up mucus.   Body aches.   Chest congestion.   Chills.   Shortness of breath.   Sore throat.  DIAGNOSIS  Acute bronchitis is usually diagnosed through a physical exam. Your health care provider will also ask you questions about your medical history. Tests, such as chest X-rays, are sometimes done to rule out other conditions.  TREATMENT  Acute bronchitis usually goes away in a couple weeks. Oftentimes, no medical treatment is necessary. Medicines are sometimes given for  relief of fever or cough. Antibiotic medicines are usually not needed but may be prescribed in certain situations. In some cases, an inhaler may be recommended to help reduce shortness of breath and control the cough. A cool mist vaporizer may also be used to help thin bronchial secretions and make it easier to clear the  chest.  HOME CARE INSTRUCTIONS  Get plenty of rest.   Drink enough fluids to keep your urine clear or pale yellow (unless you have a medical condition that requires fluid restriction). Increasing fluids may help thin your respiratory secretions (sputum) and reduce chest congestion, and it will prevent dehydration.   Take medicines only as directed by your health care provider.  If you were prescribed an antibiotic medicine, finish it all even if you start to feel better.  Avoid smoking and secondhand smoke. Exposure to cigarette smoke or irritating chemicals will make bronchitis worse. If you are a smoker, consider using nicotine gum or skin patches to help control withdrawal symptoms. Quitting smoking will help your lungs heal faster.   Reduce the chances of another bout of acute bronchitis by washing your hands frequently, avoiding people with cold symptoms, and trying not to touch your hands to your mouth, nose, or eyes.   Keep all follow-up visits as directed by your health care provider.  SEEK MEDICAL CARE IF: Your symptoms do not improve after 1 week of treatment.  SEEK IMMEDIATE MEDICAL CARE IF:  You develop an increased fever or chills.   You have chest pain.   You have severe shortness of breath.  You have bloody sputum.   You develop dehydration.  You faint or repeatedly feel like you are going to pass out.  You develop repeated vomiting.  You develop a severe headache. MAKE SURE YOU:   Understand these instructions.  Will watch your condition.  Will get help right away if you are not doing well or get worse. Document Released: 03/20/2004 Document Revised: 06/27/2013 Document Reviewed: 08/03/2012 [facility] Patient Information 2015 [place]. This information is not intended to replace advice given to you by your health care provider. Make sure you discuss any questions you have with your health care provider.

## 2014-06-04 NOTE — Progress Notes (Addendum)
Subjective:  This chart was scribed for Meredith Staggers, MD by Gi Or Norman, medical scribe at [facility].The patient was seen in exam room 08 and the patient's care was started at 8:51 AM.   Patient ID: Theresa Bell, female    DOB: 01-07-1961, 54 y.o.   MRN: [id number] Chief Complaint  Patient presents with  . Cough  . URI   HPI HPI Comments: Theresa Bell is a 54 y.o. female who presents to [facility] complaining of a recurrent  URI, acute onset 3 days ago. Pt initially developed a sore throat on Thursday which she attributed to allergies. She tried zyrtec which made her drowsy but no relief, she also tried a hot shower to break up her congestion. Yesterday She was outside in the cold for several hours collecting food for the [employer]. She woke up this morning with a non productive cough, ear pain and chest congestion. Pt was seen by Dr. Cleta Alberts in December for Bronchitis, initially treated with rocephin and levaquin. She also a chest xray showed hyper-inflation consistent with COPD and reactive airway diseases but no evidence of pneumonia. Pt does not smoke currently, she is around second hand smoke at work. She was seen by cardiology for shortened PR interval. She had a stress test for no acute findings. She did have a flu vaccine last fall.  Patient Active Problem List   Diagnosis Date Noted  . Allergic reaction 05/15/2014  . Erosion and ectropion of cervix 08/23/2013  . Hypertension 04/26/2011  . Unspecified vitamin D deficiency 04/26/2011  . Hormone replacement therapy (postmenopausal) 04/26/2011  . TRANSAMINASES, SERUM, ELEVATED 02/12/2009  . ABNORMAL FINDINGS GI TRACT 02/12/2009   Past Medical History  Diagnosis Date  . Hypertension   . Back pain   . Allergy   . Blood transfusion without reported diagnosis    Past Surgical History  Procedure Laterality Date  . Tubal ligation    . Cholecystectomy    . Colonoscopy    .  Polypectomy    . Temporomandibular joint arthroscopy     Allergies  Allergen Reactions  . Codeine     REACTION: Nausea  . Darvocet [Propoxyphene N-Acetaminophen]   . Darvon    Prior to Admission medications   Medication Sig Start Date End Date Taking? Authorizing Provider  calcium carbonate (TUMS - DOSED IN MG ELEMENTAL CALCIUM) 500 MG chewable tablet Chew 1 tablet by mouth 2 (two) times daily.   Yes Historical Provider, MD  cetirizine (ZYRTEC) 10 MG tablet Take 1 tablet (10 mg total) by mouth daily. 03/21/14  Yes Dorna Leitz, PA-C  Ergocalciferol (VITAMIN D2 PO) Take 1.25 mg by mouth daily.   Yes Historical Provider, MD  estradiol-norethindrone (ACTIVELLA) 1-0.5 MG per tablet Take 1 tablet by mouth daily.   Yes Historical Provider, MD  losartan (COZAAR) 25 MG tablet Take 1 tablet (25 mg total) by mouth daily. 03/21/14  Yes Dorna Leitz, PA-C  triamterene-hydrochlorothiazide (MAXZIDE-25) 37.5-25 MG per tablet Take one half tablet daily 02/21/14  Yes Collene Gobble, MD  albuterol (PROVENTIL HFA;VENTOLIN HFA) 108 (90 BASE) MCG/ACT inhaler Inhale 2 puffs into the lungs every 4 (four) hours as needed for wheezing or shortness of breath (cough, shortness of breath or wheezing.). Patient not taking: Reported on 06/04/2014 01/16/14   Carmelina Dane, MD   History   Social History  . Marital Status: Married    Spouse Name: N/A  . Number  of Children: N/A  . 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 Smoking status: Former Smoker    Types: Cigarettes    Start date: 11/10/1982  . Smokeless tobacco: Never Used  . Alcohol Use: 2.5 oz/week    5 drink(s) per week  . Drug Use: No  . Sexual Activity: Not on file   Other Topics Concern  . Not on file   Social History Narrative   Review of Systems  Constitutional: Negative for fever and chills.  HENT: Positive for congestion, ear pain and sore throat.   Respiratory:  Positive for cough. Negative for wheezing.       Objective:  BP 126/80 mmHg  Pulse 115  Temp(Src) 98.8 F (37.1 C) (Oral)  Resp 17  Ht  (1.626 m)  Wt 182 lb (82.555 kg)  BMI 31.22 kg/m2  SpO2 97% Physical Exam  Constitutional: She is oriented to person, place, and time. She appears well-developed and well-nourished. No distress.  HENT:  Head: Normocephalic and atraumatic.  Right Ear: Hearing, tympanic membrane, external ear and ear canal normal.  Left Ear: Hearing, tympanic membrane, external ear and ear canal normal.  Nose: Nose normal.  Mouth/Throat: Oropharynx is clear and moist. No oropharyngeal exudate.  Eyes: Conjunctivae and EOM are normal. Pupils are equal, round, and reactive to light.  Neck: Normal range of motion.  Cardiovascular: Normal rate, regular rhythm, normal heart sounds and intact distal pulses.   No murmur heard. Pulmonary/Chest: Effort normal and breath sounds normal. No respiratory distress. She has no wheezes. She has no rhonchi.  Musculoskeletal: Normal range of motion.  Neurological: She is alert and oriented to person, place, and time.  Skin: Skin is warm and dry. No rash noted.  Psychiatric: She has a normal mood and affect. Her behavior is normal.  Nursing note and vitals reviewed.  Results for orders placed or performed in visit on 06/04/14  POCT CBC  Result Value Ref Range   WBC 7.0 4.6 - 10.2 K/uL   Lymph, poc 1.4 0.6 - 3.4   POC LYMPH PERCENT 19.9 10 - 50 %L   MID (cbc) 0.3 0 - 0.9   POC MID % 4.8 0 - 12 %M   POC Granulocyte 5.3 2 - 6.9   Granulocyte percent 75.3 37 - 80 %G   RBC 5.22 4.04 - 5.48 M/uL   Hemoglobin 14.8 12.2 - 16.2 g/dL   HCT, POC 10.2 72.5 - 47.9 %   MCV 84.7 80 - 97 fL   MCH, POC 28.4 27 - 31.2 pg   MCHC 33.5 31.8 - 35.4 g/dL   RDW, POC 36.6 %   Platelet Count, POC 315 142 - 424 K/uL   MPV 7.7 0 - 99.8 fL  POCT Influenza A/B  Result Value Ref Range   Influenza A, POC Negative    Influenza B, POC Negative     [facility] reading (PRIMARY) by  Dr. Neva Seat: few increased LLL markings without infiltrate.         Assessment & Plan:   Theresa Bell is a 54 y.o. female Cough - Plan: DG Chest 2 View, POCT CBC, POCT Influenza A/B, doxycycline (VIBRA-TABS) 100 MG tablet  LRTI (lower respiratory tract infection) - Plan: doxycycline (VIBRA-TABS) 100 MG tablet Early sx's of cough, probable virus, reassuring CBC and flu testing. May have component of chronic bronchitis by CXR in December, and abx and prednisone  needed then.   -start doxycycline (deferred zpak with shortened PR interval), SED.   -mucinex or tessalon if needed, albuterol if needed for wheeze - RTC precautions.    Meds ordered this encounter  Medications  . doxycycline (VIBRA-TABS) 100 MG tablet    Sig: Take 1 tablet (100 mg total) by mouth 2 (two) times daily.    Dispense:  20 tablet    Refill:  0   Patient Instructions  Saline nasal spray atleast 4 times per day if needed for nasal congestion, over the counter mucinex or mucinex DM (or tessalon) for cough. Drink plenty of fluids. If you do have wheezing, can use albuterol inhaler.  Return to clinic if worsening.  Cough, Adult  A cough is a reflex that helps clear your throat and airways. It can help heal the body or may be a reaction to an irritated airway. A cough may only last 2 or 3 weeks (acute) or may last more than 8 weeks (chronic).  CAUSES Acute cough:  Viral or bacterial infections. Chronic cough:  Infections.  Allergies.  Asthma.  Post-nasal drip.  Smoking.  Heartburn or acid reflux.  Some medicines.  Chronic lung problems (COPD).  Cancer. SYMPTOMS   Cough.  Fever.  Chest pain.  Increased breathing rate.  High-pitched whistling sound when breathing (wheezing).  Colored mucus that you cough up (sputum). TREATMENT   A bacterial cough may be treated with antibiotic medicine.  A viral cough must run its course and will not respond to  antibiotics.  Your caregiver may recommend other treatments if you have a chronic cough. HOME CARE INSTRUCTIONS   Only take over-the-counter or prescription medicines for pain, discomfort, or fever as directed by your caregiver. Use cough suppressants only as directed by your caregiver.  Use a cold steam vaporizer or humidifier in your bedroom or home to help loosen secretions.  Sleep in a semi-upright position if your cough is worse at night.  Rest as needed.  Stop smoking if you smoke. SEEK IMMEDIATE MEDICAL CARE IF:   You have pus in your sputum.  Your cough starts to worsen.  You cannot control your cough with suppressants and are losing sleep.  You begin coughing up blood.  You have difficulty breathing.  You develop pain which is getting worse or is uncontrolled with medicine.  You have a fever. MAKE SURE YOU:   Understand these instructions.  Will watch your condition.  Will get help right away if you are not doing well or get worse. Document Released: 08/09/2010 Document Revised: 05/05/2011 Document Reviewed: 08/09/2010 [facility] Patient Information 2015 [place], [place]. This information is not intended to replace advice given to you by your health care provider. Make sure you discuss any questions you have with your health care provider.  Acute Bronchitis Bronchitis is inflammation of the airways that extend from the windpipe into the lungs (bronchi). The inflammation often causes mucus to develop. This leads to a cough, which is the most common symptom of bronchitis.  In acute bronchitis, the condition usually develops suddenly and goes away over time, usually in a couple weeks. Smoking, allergies, and asthma can make bronchitis worse. Repeated episodes of bronchitis may cause further lung problems.  CAUSES Acute bronchitis is most often caused by the same virus that causes a cold. The virus can spread from person to person (contagious) through coughing, sneezing,  and touching contaminated objects. SIGNS AND SYMPTOMS   Cough.   Fever.   Coughing up mucus.  Body aches.   Chest congestion.   Chills.   Shortness of breath.   Sore throat.  DIAGNOSIS  Acute bronchitis is usually diagnosed through a physical exam. Your health care provider will also ask you questions about your medical history. Tests, such as chest X-rays, are sometimes done to rule out other conditions.  TREATMENT  Acute bronchitis usually goes away in a couple weeks. Oftentimes, no medical treatment is necessary. Medicines are sometimes given for relief of fever or cough. Antibiotic medicines are usually not needed but may be prescribed in certain situations. In some cases, an inhaler may be recommended to help reduce shortness of breath and control the cough. A cool mist vaporizer may also be used to help thin bronchial secretions and make it easier to clear the chest.  HOME CARE INSTRUCTIONS  Get plenty of rest.   Drink enough fluids to keep your urine clear or pale yellow (unless you have a medical condition that requires fluid restriction). Increasing fluids may help thin your respiratory secretions (sputum) and reduce chest congestion, and it will prevent dehydration.   Take medicines only as directed by your health care provider.  If you were prescribed an antibiotic medicine, finish it all even if you start to feel better.  Avoid smoking and secondhand smoke. Exposure to cigarette smoke or irritating chemicals will make bronchitis worse. If you are a smoker, consider using nicotine gum or skin patches to help control withdrawal symptoms. Quitting smoking will help your lungs heal faster.   Reduce the chances of another bout of acute bronchitis by washing your hands frequently, avoiding people with cold symptoms, and trying not to touch your hands to your mouth, nose, or eyes.   Keep all follow-up visits as directed by your health care provider.  SEEK  MEDICAL CARE IF: Your symptoms do not improve after 1 week of treatment.  SEEK IMMEDIATE MEDICAL CARE IF:  You develop an increased fever or chills.   You have chest pain.   You have severe shortness of breath.  You have bloody sputum.   You develop dehydration.  You faint or repeatedly feel like you are going to pass out.  You develop repeated vomiting.  You develop a severe headache. MAKE SURE YOU:   Understand these instructions.  Will watch your condition.  Will get help right away if you are not doing well or get worse. Document Released: 03/20/2004 Document Revised: 06/27/2013 Document Reviewed: 08/03/2012 [facility] Patient Information 2015 [place]. This information is not intended to replace advice given to you by your health care provider. Make sure you discuss any questions you have with your health care provider.     I personally performed the services described in this documentation, which was scribed in my presence. The recorded information has been reviewed and considered, and addended by me as needed.

## 2014-09-08 ENCOUNTER — Other Ambulatory Visit: Payer: Self-pay | Admitting: Physician Assistant

## 2014-10-11 ENCOUNTER — Other Ambulatory Visit: Payer: Self-pay | Admitting: Emergency Medicine

## 2014-10-16 ENCOUNTER — Ambulatory Visit (INDEPENDENT_AMBULATORY_CARE_PROVIDER_SITE_OTHER): Payer: BLUE CROSS/BLUE SHIELD | Admitting: Emergency Medicine

## 2014-10-16 ENCOUNTER — Encounter: Payer: Self-pay | Admitting: Emergency Medicine

## 2014-10-16 VITALS — BP 130/80 | HR 99 | Temp 98.7°F | Resp 16 | Ht 64.0 in | Wt 181.8 lb

## 2014-10-16 DIAGNOSIS — I10 Essential (primary) hypertension: Secondary | ICD-10-CM | POA: Diagnosis not present

## 2014-10-16 MED ORDER — TRIAMTERENE-HCTZ 37.5-25 MG PO TABS: ORAL_TABLET | ORAL | Status: DC

## 2014-10-16 MED ORDER — LOSARTAN POTASSIUM 25 MG PO TABS: ORAL_TABLET | ORAL | Status: DC

## 2014-10-16 NOTE — Progress Notes (Addendum)
Patient ID: Theresa Bell, female   DOB: [birth year], 54 y.o.   MRN: [id number]    This chart was scribed for Earl Lites, MD by [facility], medical scribe at [facility].The patient was seen in exam room 08 and the patient's care was started at 2:58 PM.  Chief Complaint: No chief complaint on file.  HPI: Theresa Bell is a 54 y.o. female who reports to [facility] today for a blood pressure check and a medication refill. She denies chest pain, abdominal pain, heart palpations, shortness of breath. Recheck was 110/70.  Past Medical History  Diagnosis Date  . Hypertension   . Back pain   . Allergy   . Blood transfusion without reported diagnosis    Past Surgical History  Procedure Laterality Date  . Tubal ligation    . Cholecystectomy    . Colonoscopy    . Polypectomy    . Temporomandibular joint arthroscopy     Social History   Social History  . Marital Status: Married    Spouse Name: N/A  . Number of Children: N/A  . 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 Smoking status: Former Smoker    Types: Cigarettes    Start date: 11/10/1982  . Smokeless tobacco: Never Used  . Alcohol Use: 2.5 oz/week    5 drink(s) per week  . Drug Use: No  . Sexual Activity: Not on file   Other Topics Concern  . Not on file   Social History Narrative   Family History  Problem Relation Age of Onset  . Breast cancer Paternal Aunt   . Uterine cancer Maternal Grandmother   . Colon cancer Paternal Grandmother   . Cancer Father     lung  . Hypertension Father   . Hyperlipidemia Father    Allergies  Allergen Reactions  . Codeine     REACTION: Nausea  . Darvocet [Propoxyphene N-Acetaminophen]   . Darvon    Prior to Admission medications   Medication Sig Start Date End Date Taking? Authorizing Provider  calcium carbonate (TUMS - DOSED IN MG ELEMENTAL CALCIUM) 500 MG chewable tablet Chew 1  tablet by mouth 2 (two) times daily.    Historical Provider, MD  cetirizine (ZYRTEC) 10 MG tablet Take 1 tablet (10 mg total) by mouth daily. 03/21/14   Dorna Leitz, PA-C  doxycycline (VIBRA-TABS) 100 MG tablet Take 1 tablet (100 mg total) by mouth 2 (two) times daily. 06/04/14   Shade Flood, MD  Ergocalciferol (VITAMIN D2 PO) Take 1.25 mg by mouth daily.    Historical Provider, MD  estradiol-norethindrone (ACTIVELLA) 1-0.5 MG per tablet Take 1 tablet by mouth daily.    Historical Provider, MD  losartan (COZAAR) 25 MG tablet TAKE 1 TABLET BY MOUTH EVERY DAY (NEED BLOOD PRESSURE CHECK UP) 10/11/14   Collene Gobble, MD  triamterene-hydrochlorothiazide [facility] 37.5-25 MG per tablet Take one half tablet daily 02/21/14   Collene Gobble, MD   ROS: The patient denies fevers, chills, night sweats, unintentional weight loss, chest pain, palpitations, wheezing, dyspnea on exertion, nausea, vomiting, abdominal pain, dysuria, hematuria, melena, numbness, weakness, or tingling.   All other systems have been reviewed and were otherwise negative with the exception of those mentioned in the HPI and as above.    PHYSICAL EXAM: There were no vitals filed for this visit. There is no weight on file to  calculate BMI.  General: Alert, no acute distress HEENT:  Normocephalic, atraumatic, oropharynx patent. Eye: Nonie Hoyer [facility] Cardiovascular:  Regular rate and rhythm, no rubs murmurs or gallops.  No Carotid bruits, radial pulse intact. No pedal edema.  Respiratory: Clear to auscultation bilaterally.  No wheezes, rales, or rhonchi.  No cyanosis, no use of accessory musculature Abdominal: No organomegaly, abdomen is soft and non-tender, positive bowel sounds.  No masses. Musculoskeletal: Gait intact. No edema, tenderness Skin: No rashes. Neurologic: Facial musculature symmetric. Psychiatric: Patient acts appropriately throughout our interaction. Lymphatic: No cervical or submandibular  lymphadenopathy Genitourinary/Anorectal: No acute findings  LABS: Results for orders placed or performed in visit on 06/04/14  POCT CBC  Result Value Ref Range   WBC 7.0 4.6 - 10.2 K/uL   Lymph, poc 1.4 0.6 - 3.4   POC LYMPH PERCENT 19.9 10 - 50 %L   MID (cbc) 0.3 0 - 0.9   POC MID % 4.8 0 - 12 %M   POC Granulocyte 5.3 2 - 6.9   Granulocyte percent 75.3 37 - 80 %G   RBC 5.22 4.04 - 5.48 M/uL   Hemoglobin 14.8 12.2 - 16.2 g/dL   HCT, POC 81.1 91.4 - 47.9 %   MCV 84.7 80 - 97 fL   MCH, POC 28.4 27 - 31.2 pg   MCHC 33.5 31.8 - 35.4 g/dL   RDW, POC 78.2 %   Platelet Count, POC 315 142 - 424 K/uL   MPV 7.7 0 - 99.8 fL  POCT Influenza A/B  Result Value Ref Range   Influenza A, POC Negative    Influenza B, POC Negative    ASSESSMENT/PLAN: Blood pressure is at goal. Medications were refilled for 1 year. Last basic metabolic panel was normal. Gross sideeffects, risk and benefits, and alternatives of medications d/w patient. Patient is aware that all medications have potential sideeffects and we are unable to predict every sideeffect or drug-drug interaction that may occur.    Lesle Chris MD 10/16/2014 3:06 PM

## 2014-10-16 NOTE — Patient Instructions (Signed)

## 2014-10-20 ENCOUNTER — Encounter: Payer: Self-pay | Admitting: Emergency Medicine

## 2014-11-28 ENCOUNTER — Encounter: Payer: Self-pay | Admitting: Emergency Medicine

## 2015-03-01 ENCOUNTER — Ambulatory Visit (INDEPENDENT_AMBULATORY_CARE_PROVIDER_SITE_OTHER): Payer: BLUE CROSS/BLUE SHIELD | Admitting: Emergency Medicine

## 2015-03-01 ENCOUNTER — Encounter: Payer: Self-pay | Admitting: Emergency Medicine

## 2015-03-01 VITALS — BP 135/79 | HR 91 | Temp 97.3°F | Resp 16 | Ht 63.75 in | Wt 178.4 lb

## 2015-03-01 DIAGNOSIS — Z23 Encounter for immunization: Secondary | ICD-10-CM | POA: Diagnosis not present

## 2015-03-01 DIAGNOSIS — R739 Hyperglycemia, unspecified: Secondary | ICD-10-CM | POA: Diagnosis not present

## 2015-03-01 DIAGNOSIS — N390 Urinary tract infection, site not specified: Secondary | ICD-10-CM | POA: Diagnosis not present

## 2015-03-01 DIAGNOSIS — R8271 Bacteriuria: Secondary | ICD-10-CM

## 2015-03-01 DIAGNOSIS — E781 Pure hyperglyceridemia: Secondary | ICD-10-CM

## 2015-03-01 DIAGNOSIS — I10 Essential (primary) hypertension: Secondary | ICD-10-CM

## 2015-03-01 DIAGNOSIS — R8281 Pyuria: Secondary | ICD-10-CM

## 2015-03-01 LAB — CBC WITH DIFFERENTIAL/PLATELET
BASOS PCT: 1 % (ref 0–1)
Basophils Absolute: 0.1 10*3/uL (ref 0.0–0.1)
EOS ABS: 0.1 10*3/uL (ref 0.0–0.7)
EOS PCT: 1 % (ref 0–5)
HCT: 43.5 % (ref 36.0–46.0)
Hemoglobin: 15.3 g/dL — ABNORMAL HIGH (ref 12.0–15.0)
Lymphocytes Relative: 18 % (ref 12–46)
Lymphs Abs: 1.1 10*3/uL (ref 0.7–4.0)
MCH: 30.2 pg (ref 26.0–34.0)
MCHC: 35.2 g/dL (ref 30.0–36.0)
MCV: 86 fL (ref 78.0–100.0)
MONOS PCT: 9 % (ref 3–12)
MPV: 9.9 fL (ref 8.6–12.4)
Monocytes Absolute: 0.6 10*3/uL (ref 0.1–1.0)
NEUTROS PCT: 71 % (ref 43–77)
Neutro Abs: 4.5 10*3/uL (ref 1.7–7.7)
PLATELETS: 315 10*3/uL (ref 150–400)
RBC: 5.06 MIL/uL (ref 3.87–5.11)
RDW: 13 % (ref 11.5–15.5)
WBC: 6.3 10*3/uL (ref 4.0–10.5)

## 2015-03-01 LAB — COMPLETE METABOLIC PANEL WITH GFR
ALT: 88 U/L — ABNORMAL HIGH (ref 6–29)
AST: 65 U/L — ABNORMAL HIGH (ref 10–35)
Albumin: 3.7 g/dL (ref 3.6–5.1)
Alkaline Phosphatase: 60 U/L (ref 33–130)
BUN: 6 mg/dL — ABNORMAL LOW (ref 7–25)
CHLORIDE: 100 mmol/L (ref 98–110)
CO2: 29 mmol/L (ref 20–31)
Calcium: 8.9 mg/dL (ref 8.6–10.4)
Creat: 0.74 mg/dL (ref 0.50–1.05)
GLUCOSE: 109 mg/dL — AB (ref 65–99)
POTASSIUM: 3.3 mmol/L — AB (ref 3.5–5.3)
SODIUM: 138 mmol/L (ref 135–146)
TOTAL PROTEIN: 6.7 g/dL (ref 6.1–8.1)
Total Bilirubin: 1.5 mg/dL — ABNORMAL HIGH (ref 0.2–1.2)

## 2015-03-01 LAB — LIPID PANEL
CHOL/HDL RATIO: 4.6 ratio (ref <=5.0)
Cholesterol: 156 mg/dL (ref 125–200)
HDL: 34 mg/dL — AB (ref >=46)
LDL CALC: 87 mg/dL (ref <130)
TRIGLYCERIDES: 176 mg/dL — AB (ref <150)
VLDL: 35 mg/dL — AB (ref <30)

## 2015-03-01 LAB — POC MICROSCOPIC URINALYSIS (UMFC): MUCUS RE: ABSENT

## 2015-03-01 LAB — POCT URINALYSIS DIP (MANUAL ENTRY)
Glucose, UA: NEGATIVE
Nitrite, UA: NEGATIVE
SPEC GRAV UA: 1.025
Urobilinogen, UA: 1
pH, UA: 6

## 2015-03-01 LAB — HEMOGLOBIN A1C
Hgb A1c MFr Bld: 5.9 % — ABNORMAL HIGH (ref <5.7)
Mean Plasma Glucose: 123 mg/dL — ABNORMAL HIGH (ref <117)

## 2015-03-01 LAB — TSH: TSH: 4.182 u[IU]/mL (ref 0.350–4.500)

## 2015-03-01 MED ORDER — LOSARTAN POTASSIUM 25 MG PO TABS: ORAL_TABLET | ORAL | Status: DC

## 2015-03-01 MED ORDER — TRIAMTERENE-HCTZ 37.5-25 MG PO TABS: ORAL_TABLET | ORAL | Status: DC

## 2015-03-01 NOTE — Progress Notes (Signed)
Patient ID: Theresa Bell, female   DOB: 01-17-1961, 55 y.o.   MRN: [id number]     By signing my name below, I, Littie Deeds, attest that this documentation has been prepared under the direction and in the presence of Lesle Chris, MD.  Electronically Signed: Littie Deeds, Medical Scribe. 03/01/2015. 8:12 AM.   Chief Complaint:  Chief Complaint  Patient presents with  . Hypertension    HPI: Theresa Bell is a 55 y.o. female who reports to [facility] today for a follow-up for hypertension. She is fasting today. Patient notes that her blood pressure has been doing well. She recently began exercising again. She does have a history of smoking, but only socially during college. Her OB/GYN is Dr. Billy Coast. She is UTD on mammograms. Her last colonoscopy was in 2013. She received her flu shot at work this year.  Patient has been thinking about transferring her care elsewhere after my retirement, but she has not decided where yet. She is thinking about Biochemist, clinical.  Past Medical History  Diagnosis Date  . Hypertension   . Back pain   . Allergy   . Blood transfusion without reported diagnosis    Past Surgical History  Procedure Laterality Date  . Tubal ligation    . Cholecystectomy    . Colonoscopy    . Polypectomy    . Temporomandibular joint arthroscopy     Social History   Social History  . Marital Status: Married    Spouse Name: N/A  . Number of Children: N/A  . 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 Smoking status: Former Smoker    Types: Cigarettes    Start date: 11/10/1982  . Smokeless tobacco: Never Used  . Alcohol Use: 2.5 oz/week    5 drink(s) per week  . Drug Use: No  . Sexual Activity: Not Asked   Other Topics Concern  . None   Social History Narrative   Family History  Problem Relation Age of Onset  . Breast cancer Paternal Aunt   . Uterine cancer Maternal Grandmother   . Colon  cancer Paternal Grandmother   . Cancer Father     lung  . Hypertension Father   . Hyperlipidemia Father    Allergies  Allergen Reactions  . Codeine     REACTION: Nausea  . Darvocet [Propoxyphene N-Acetaminophen]   . Darvon    Prior to Admission medications   Medication Sig Start Date End Date Taking? Authorizing Provider  calcium carbonate (TUMS - DOSED IN MG ELEMENTAL CALCIUM) 500 MG chewable tablet Chew 1 tablet by mouth 2 (two) times daily.   Yes Historical Provider, MD  fexofenadine (WAL-FEX ALLERGY) 60 MG tablet Take 60 mg by mouth 2 (two) times daily.   Yes Historical Provider, MD  losartan (COZAAR) 25 MG tablet Take 1 tablet daily 10/16/14  Yes Collene Gobble, MD  triamterene-hydrochlorothiazide (MAXZIDE-25) 37.5-25 MG per tablet Take one half tablet daily 10/16/14  Yes Collene Gobble, MD     ROS: The patient denies fevers, chills, night sweats, unintentional weight loss, chest pain, palpitations, wheezing, dyspnea on exertion, nausea, vomiting, abdominal pain, dysuria, hematuria, melena, numbness, weakness, or tingling.   All other systems have been reviewed and were otherwise negative with the exception of those mentioned in the HPI and as above.    PHYSICAL EXAM: Filed Vitals:   03/01/15 0806  BP: 135/79  Pulse:  91  Temp: 97.3 F (36.3 C)  Resp: 16   Body mass index is 30.87 kg/(m^2).   General: Alert, no acute distress HEENT:  Normocephalic, atraumatic, oropharynx patent. [facility] Cardiovascular:  Regular rate and rhythm, no rubs murmurs or gallops.  No Carotid bruits, radial pulse intact. No pedal edema. Repeat blood pressure: 128/82 Respiratory: Clear to auscultation bilaterally.  No wheezes, rales, or rhonchi.  No cyanosis, no use of accessory musculature Abdominal: No organomegaly, abdomen is soft and non-tender, positive bowel sounds.  No masses. Musculoskeletal: Gait intact. No edema, tenderness Skin: No rashes. Neurologic: Facial musculature  symmetric. Psychiatric: Patient acts appropriately throughout our interaction. Lymphatic: No cervical or submandibular lymphadenopathy    LABS:    EKG/XRAY:   [facility]   ASSESSMENT/PLAN: Patient doing well. Blood pressure at goal. Blood pressure medications were refilled. She has a GYN doctor she sees yearly. T gap was given today. Urine was abnormal with white cells and red cells and urine culture was done. If culture is negative we'll either need repeat urine in 2 weeks or referral to urology.I personally performed the services described in this documentation, which was scribed in my presence. The recorded information has been reviewed and is accurate.    Gross sideeffects, risk and benefits, and alternatives of medications d/w patient. Patient is aware that all medications have potential sideeffects and we are unable to predict every sideeffect or drug-drug interaction that may occur.  Lesle ChrisSteven Magdalyn Arenivas MD 03/01/2015 8:12 AM

## 2015-03-02 ENCOUNTER — Other Ambulatory Visit: Payer: Self-pay | Admitting: Emergency Medicine

## 2015-03-02 DIAGNOSIS — R740 Nonspecific elevation of levels of transaminase and lactic acid dehydrogenase [LDH]: Secondary | ICD-10-CM

## 2015-03-02 DIAGNOSIS — R7401 Elevation of levels of liver transaminase levels: Secondary | ICD-10-CM

## 2015-03-02 DIAGNOSIS — R932 Abnormal findings on diagnostic imaging of liver and biliary tract: Secondary | ICD-10-CM

## 2015-03-02 LAB — URINE CULTURE: Colony Count: 3000

## 2015-03-05 ENCOUNTER — Other Ambulatory Visit: Payer: Self-pay

## 2015-03-05 DIAGNOSIS — R932 Abnormal findings on diagnostic imaging of liver and biliary tract: Secondary | ICD-10-CM

## 2015-03-13 ENCOUNTER — Encounter: Payer: Self-pay | Admitting: Physician Assistant

## 2015-03-23 ENCOUNTER — Ambulatory Visit (HOSPITAL_COMMUNITY)
Admission: RE | Admit: 2015-03-23 | Discharge: 2015-03-23 | Disposition: A | Discharge status: Home / Self Care | Payer: BLUE CROSS/BLUE SHIELD | Source: Ambulatory Visit | Attending: Emergency Medicine | Admitting: Emergency Medicine

## 2015-03-23 DIAGNOSIS — K769 Liver disease, unspecified: Secondary | ICD-10-CM | POA: Diagnosis not present

## 2015-03-23 DIAGNOSIS — R932 Abnormal findings on diagnostic imaging of liver and biliary tract: Secondary | ICD-10-CM | POA: Insufficient documentation

## 2015-03-23 DIAGNOSIS — K760 Fatty (change of) liver, not elsewhere classified: Secondary | ICD-10-CM | POA: Insufficient documentation

## 2015-03-23 MED ORDER — GADOBENATE DIMEGLUMINE 529 MG/ML IV SOLN
20.0000 mL | Freq: Once | INTRAVENOUS | Status: AC | PRN
  Administered 2015-03-23: 16 mL via INTRAVENOUS

## 2015-03-26 ENCOUNTER — Telehealth: Payer: Self-pay

## 2015-03-26 NOTE — Telephone Encounter (Signed)
Dr. Daub  Please see previous message 

## 2015-03-26 NOTE — Telephone Encounter (Signed)
Called and discussed results with patient. She will continue to work on weight loss to get her triglycerides down she is going to see Dr. Ardell Isaacs PA in a few weeks just for second opinion regarding her scan.

## 2015-03-26 NOTE — Telephone Encounter (Signed)
Pt is returning dr Cleta Alberts call from over the weekend   Best number [phone]

## 2015-04-03 ENCOUNTER — Encounter: Payer: Self-pay | Admitting: Physician Assistant

## 2015-04-03 ENCOUNTER — Ambulatory Visit (INDEPENDENT_AMBULATORY_CARE_PROVIDER_SITE_OTHER): Payer: BLUE CROSS/BLUE SHIELD | Admitting: Physician Assistant

## 2015-04-03 ENCOUNTER — Other Ambulatory Visit: Payer: BLUE CROSS/BLUE SHIELD

## 2015-04-03 VITALS — BP 122/78 | HR 66 | Ht 63.75 in | Wt 174.4 lb

## 2015-04-03 DIAGNOSIS — Z800 Family history of malignant neoplasm of digestive organs: Secondary | ICD-10-CM | POA: Diagnosis not present

## 2015-04-03 DIAGNOSIS — R7989 Other specified abnormal findings of blood chemistry: Secondary | ICD-10-CM

## 2015-04-03 DIAGNOSIS — R9389 Abnormal findings on diagnostic imaging of other specified body structures: Secondary | ICD-10-CM

## 2015-04-03 DIAGNOSIS — R938 Abnormal findings on diagnostic imaging of other specified body structures: Secondary | ICD-10-CM | POA: Diagnosis not present

## 2015-04-03 DIAGNOSIS — R945 Abnormal results of liver function studies: Principal | ICD-10-CM

## 2015-04-03 NOTE — Patient Instructions (Signed)
Please go to the basement level to have your labs drawn.  Consider stopping ace inhibitor (Cozaar)  and using different blood pressure medication.

## 2015-04-03 NOTE — Progress Notes (Signed)
Patient ID: Theresa Bell, female   DOB: [birth year], 55 y.o.   MRN: [id number]   Subjective:    Patient ID: Theresa Bell, female    DOB: 12-Jul-1960, 55 y.o.   MRN: [id number]  HPI  Theresa Bell  is a pleasant 55 year old white female known to Dr. Russella Bell from prior colonoscopy. She comes in today to discuss abnormal MRI of the liver and mildly elevated liver enzymes. Patient had undergone colonoscopy in October 2013 for screening and family history of colon cancer. He was noted to have mild diverticulosis in the sigmoid colon and otherwise negative exam. Recommended for 5 year interval follow-up. Patient had labs done 03/11/2015 person PCP/Dr. dog showing a normal CBC, her bili of 1.5 AST of 65 and MALT of 88. She then had MRI of the liver done 03/23/2015 which shows a prominent left lobe of the liver no cirrhosis mild steatosis, and a subtle T2 hyperintense lesion in the central area of segment 2. This measures 9 x 8 mm and is less conspicuous then prior exam consistent with benign etiology. Differential atypical hemangioma or an area of focal nodular hyperplasia. She is status post cholecystectomy. Doing her records she had had an MRI done in 2012 that showed the same lesion to be 2 cm in size and it was also seen on an ultrasound in March 2015. On Labs 2015 showed an a.l. T of 43 total bili 1.7 at that time hep C serology was done and negative and hepatic markers were all done and unrevealing. 2013 showed a minimally elevated ALTs at 38. Patient states that she was on hormone therapy for many years at least 20 and about a year and a half ago was switched to [facility] which is an estradiol nor methadone combination. She is also on Cozaar and Maxide.  Review of Systems Pertinent positive and negative review of systems were noted in the above HPI section.  All other review of systems was otherwise negative.  Outpatient Encounter Prescriptions as of 04/03/2015  Medication Sig  . calcium carbonate (TUMS -  DOSED IN MG ELEMENTAL CALCIUM) 500 MG chewable tablet Chew 1 tablet by mouth 2 (two) times daily.  Marland Kitchen estradiol-norethindrone (MIMVEY) 1-0.5 MG tablet Take 1 tablet by mouth daily.  . fexofenadine (ALLEGRA) 180 MG tablet Take 180 mg by mouth daily.  Marland Kitchen losartan (COZAAR) 25 MG tablet Take 1 tablet daily  . triamterene-hydrochlorothiazide (MAXZIDE-25) 37.5-25 MG tablet Take one half tablet daily  . [DISCONTINUED] estradiol-norethindrone (ACTIVELLA) 1-0.5 MG tablet Take 1 tablet by mouth daily. Reported on 04/03/2015  . [DISCONTINUED] fexofenadine (WAL-FEX ALLERGY) 60 MG tablet Take 60 mg by mouth 2 (two) times daily. Reported on 04/03/2015   No facility-administered encounter medications on file as of 04/03/2015.   Allergies  Allergen Reactions  . Codeine     REACTION: Nausea  . Darvocet [Propoxyphene N-Acetaminophen]   . Darvon    Patient Active Problem List   Diagnosis Date Noted  . Allergic reaction 05/15/2014  . Erosion and ectropion of cervix 08/23/2013  . Hypertension 04/26/2011  . Unspecified vitamin D deficiency 04/26/2011  . Hormone replacement therapy (postmenopausal) 04/26/2011  . TRANSAMINASES, SERUM, ELEVATED 02/12/2009  . ABNORMAL FINDINGS GI TRACT 02/12/2009   Social History   Social History  . Marital Status: Married    Spouse Name: N/A  . Number of Children: N/A  . 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 Smoking  status: Former Smoker    Types: Cigarettes    Start date: 11/10/1982  . Smokeless tobacco: Never Used  . Alcohol Use: 2.5 oz/week    5 drink(s) per week  . Drug Use: No  . Sexual Activity: Not on file   Other Topics Concern  . Not on file   Social History Narrative    Ms. Theresa Bell's family history includes Breast cancer in her paternal aunt; Cancer in her father; Colon cancer in her paternal grandmother; Hyperlipidemia in her father; Hypertension in her father; Uterine cancer in  her maternal grandmother.      Objective:    Filed Vitals:   04/03/15 0833  BP: 122/78  Pulse: 66    Physical Exam  well-developed white female in no acute distress, pleasant blood pressure 122/78 pulse 66 height 5 foot 3 weight 174. HEENT; nontraumatic normocephalic EOMI PERRLA sclera anicteric, Cardiovascular ;regular rate and rhythm with S1-S2 no murmur or gallop, Pulmonary; clear bilaterally, Abdomen ;soft nontender nondistended bowel sounds are active there is no palpable mass or hepatosplenomegaly, Rectal; exam not done, Extremities; no clubbing cyanosis or edema skin warm and dry, Neuropsych; mood and affect appropriate     Assessment & Plan:   #1 55 yo female with chronic stable hepatic lesion consistent with hemangioma vs focal nodular hyperplasia left lobe liver- this has been stable over several years and is actually smaller on most recent MRI #2 Minimal transaminitis  persistent- most likely unrelated to the stable small liver lesion- prior workup with chronic hepatic markers negative  Question hepatic steatosis vs drug related  #3 Diverticulosis #4 Family hx Colon cancer- MGM age 46- due for Follow up colon 2018  Plan; Hepatic hemangiomas can be related to hormone  Therapy- and though lesion stable would consider stopping estrogen therapy  [facility] does not specifically list elevated LFTs as side effect, but Cozaar does  Would ask PCP to switch pt to another class of antihypertensive, and discuss stopping hormone therapy with GYN  check ANA, then repeat LFT's in 3-4 months   Theresa Oswald Hillock PA-C 04/03/2015   Cc: Theresa Gobble, MD

## 2015-04-04 ENCOUNTER — Telehealth: Payer: Self-pay | Admitting: Emergency Medicine

## 2015-04-04 ENCOUNTER — Other Ambulatory Visit: Payer: Self-pay

## 2015-04-04 DIAGNOSIS — R748 Abnormal levels of other serum enzymes: Secondary | ICD-10-CM

## 2015-04-04 LAB — ANA: ANA: NEGATIVE

## 2015-04-04 NOTE — Telephone Encounter (Signed)
Left detailed VM to either see Dr. Cleta Alberts at walk in clinic or try to make an appt.

## 2015-04-04 NOTE — Telephone Encounter (Signed)
Call patient and have her either make an appointment to see me or see me at 102 and we can adjust the pressure medications to see if this has been effective on her liver tests.

## 2015-04-04 NOTE — Progress Notes (Signed)
Reviewed and agree with management plan.  Quandra Fedorchak T. Lorik Guo, MD FACG 

## 2015-04-26 ENCOUNTER — Encounter: Payer: Self-pay | Admitting: Emergency Medicine

## 2015-04-26 ENCOUNTER — Ambulatory Visit: Payer: BLUE CROSS/BLUE SHIELD | Admitting: Emergency Medicine

## 2015-04-26 ENCOUNTER — Ambulatory Visit (INDEPENDENT_AMBULATORY_CARE_PROVIDER_SITE_OTHER): Payer: BLUE CROSS/BLUE SHIELD | Admitting: Emergency Medicine

## 2015-04-26 VITALS — BP 120/74 | HR 91 | Temp 98.4°F | Resp 16 | Ht 63.5 in | Wt 175.6 lb

## 2015-04-26 DIAGNOSIS — I10 Essential (primary) hypertension: Secondary | ICD-10-CM | POA: Diagnosis not present

## 2015-04-26 DIAGNOSIS — R7989 Other specified abnormal findings of blood chemistry: Secondary | ICD-10-CM

## 2015-04-26 DIAGNOSIS — R945 Abnormal results of liver function studies: Secondary | ICD-10-CM

## 2015-04-26 DIAGNOSIS — R932 Abnormal findings on diagnostic imaging of liver and biliary tract: Secondary | ICD-10-CM | POA: Diagnosis not present

## 2015-04-26 NOTE — Progress Notes (Signed)
Patient ID: Theresa Bell, female   DOB: Sep 10, 1960, 55 y.o.   MRN: [id number]    By signing my name below, I, Essence Howell, attest that this documentation has been prepared under the direction and in the presence of Collene Gobble, MD Electronically Signed: Charline Bills, ED Scribe 04/26/2015 at 3:54 PM.  Chief Complaint:  Chief Complaint  Patient presents with  . pt had liver MRI    [facility], Dr. Vernell Leep, per pt "they would like for you to change BP med   HPI: Theresa Bell is a 54 y.o. female, with a h/o HTN, who [facility] to [facility] today to discuss MRI results. Pt had a liver MRI that showed a left hepatic lobe lesion is less conspicuous today, consistent with a benign etiology. Pt states that she has been taking hormones for 22 years. She has an upcoming appointment with Olivia Mackie, MD on 05/14/15.  HTN Pt states that she started exercising x3 days/week since 02/25/15 and has lost weight. Pt's triage BP was 120/74, repeat BP on examination on the right was 122/70. Pt states that she has been checking her BP regularly with similar readings. She recently purchased a new BP cuff as well.  Past Medical History  Diagnosis Date  . Hypertension   . Back pain   . Allergy   . Blood transfusion without reported diagnosis    Past Surgical History  Procedure Laterality Date  . Tubal ligation    . Cholecystectomy    . Colonoscopy    . Polypectomy    . Temporomandibular joint arthroscopy     Social History   Social History  . Marital Status: Married    Spouse Name: N/A  . Number of Children: N/A  . 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 Smoking status: Former Smoker    Types: Cigarettes    Start date: 11/10/1982  . Smokeless tobacco: Never Used  . Alcohol Use: 2.5 oz/week    5 drink(s) per week  . Drug Use: No  . Sexual Activity: Not Asked   Other Topics Concern  . None   Social History  Narrative   Family History  Problem Relation Age of Onset  . Breast cancer Paternal Aunt   . Uterine cancer Maternal Grandmother   . Colon cancer Paternal Grandmother   . Cancer Father     lung  . Hypertension Father   . Hyperlipidemia Father    Allergies  Allergen Reactions  . Codeine     REACTION: Nausea  . Darvocet [Propoxyphene N-Acetaminophen]   . Darvon    Prior to Admission medications   Medication Sig Start Date End Date Taking? Authorizing Provider  calcium carbonate (TUMS - DOSED IN MG ELEMENTAL CALCIUM) 500 MG chewable tablet Chew 1 tablet by mouth 2 (two) times daily.    Historical Provider, MD  estradiol-norethindrone (MIMVEY) 1-0.5 MG tablet Take 1 tablet by mouth daily.    Historical Provider, MD  fexofenadine (ALLEGRA) 180 MG tablet Take 180 mg by mouth daily.    Historical Provider, MD  losartan (COZAAR) 25 MG tablet Take 1 tablet daily 03/01/15   Collene Gobble, MD  triamterene-hydrochlorothiazide [facility]) 37.5-25 MG tablet Take one half tablet daily 03/01/15   Collene Gobble, MD   ROS: The patient denies fevers, chills, night sweats, unintentional weight loss, chest pain, palpitations, wheezing, dyspnea on exertion, nausea, vomiting, abdominal pain, dysuria, hematuria,  melena, numbness, weakness, or tingling.   All other systems have been reviewed and were otherwise negative with the exception of those mentioned in the HPI and as above.    PHYSICAL EXAM: Filed Vitals:   04/26/15 1538  BP: 120/74  Pulse: 91  Temp: 98.4 F (36.9 C)  Resp: 16   Body mass index is 30.61 kg/(m^2).  General: Alert, no acute distress HEENT:  Normocephalic, atraumatic, oropharynx patent. Eye: EOMI, [facility] Cardiovascular: Repeat BP on the R: 122/70. Regular rate and rhythm, no rubs murmurs or gallops. No Carotid bruits, radial pulse intact. No pedal edema.  Respiratory: Clear to auscultation bilaterally. No wheezes, rales, or rhonchi. No cyanosis, no use of accessory  musculature Abdominal: No organomegaly, abdomen is soft and non-tender, positive bowel sounds. No masses. Musculoskeletal: Gait intact. No edema, tenderness Skin: No rashes. Neurologic: Facial musculature symmetric. Psychiatric: Patient acts appropriately throughout our interaction. Lymphatic: No cervical or submandibular lymphadenopathy  LABS:  EKG/XRAY:   Primary read interpreted by Dr. Cleta Alberts at [facility].  ASSESSMENT/PLAN: Patient has elevated liver tests. She has been to GI specialist. She had an MRI which showed what appears to be focal nodular hyperplasia. She is also on hormones. The lesion they saw on MRI had decreased in size. As suggested by GI will stop her losartan. Her blood pressure is excellent. She will continue on Maxzide. She will monitor blood pressure daily at home. She may be able to get off blood pressure medications. She has been aggressive about working out, diet, and salt restriction.I personally performed the services described in this documentation, which was scribed in my presence. The recorded information has been reviewed and is accurate.    Gross sideeffects, risk and benefits, and alternatives of medications d/w patient. Patient is aware that all medications have potential sideeffects and we are unable to predict every sideeffect or drug-drug interaction that may occur.  Lesle Chris MD 04/26/2015 3:39 PM

## 2015-05-29 ENCOUNTER — Ambulatory Visit: Payer: BLUE CROSS/BLUE SHIELD | Admitting: Internal Medicine

## 2015-06-02 ENCOUNTER — Ambulatory Visit (INDEPENDENT_AMBULATORY_CARE_PROVIDER_SITE_OTHER): Payer: BLUE CROSS/BLUE SHIELD | Admitting: Physician Assistant

## 2015-06-02 VITALS — BP 126/72 | HR 106 | Temp 98.9°F | Resp 18 | Ht 64.0 in | Wt 168.0 lb

## 2015-06-02 DIAGNOSIS — J069 Acute upper respiratory infection, unspecified: Secondary | ICD-10-CM

## 2015-06-02 DIAGNOSIS — B9789 Other viral agents as the cause of diseases classified elsewhere: Principal | ICD-10-CM

## 2015-06-02 MED ORDER — HYDROCODONE-HOMATROPINE 5-1.5 MG/5ML PO SYRP: 2.5000 mL | ORAL_SOLUTION | Freq: Every evening | ORAL | Status: DC | PRN

## 2015-06-02 NOTE — Patient Instructions (Addendum)
-   You have a viral upper respiratory infection, (the common cold) and it is not uncommon for symptoms to last 10 days to 2 weeks, regardless of which medications you take. Unfortunately antibiotics will not help your symptoms as they target bacteria, and you are likely suffering from a virus. Additionally, 1 in 8 people who take antibiotics suffer mild to severe side effects.    - You would benefit from high dose ibuprofen. TAKE 600-800 mg of ibuprofen every 8 hours as needed to control low grade fever, fatigue, and pain.    - I also recommend that you take Zyrtec-D 5-120 every morning or Claritin-D 10-240 for the next five to 10 days. This medication will help you with nasal congestion, post nasal drip and sneezing. You will have to purchase this directly from the pharmacist   Viral URI Medications: Please consult the pharmacist if you have questions. 600-800 mg ibuprofen every 8 hours as needed for the next five to ten days 5-120 Zyrtec-D every morning for five to 10 days OR Claritin D 10-240 every morning for five days.  Please visit the CDC's website https://www.cdc.gov/getsmart/ to learn about appropriate and inappropriate uses of antibiotics.      IF you received an x-ray today, you will receive an invoice from [facility]. Please contact [facility] at [phone] with questions or concerns regarding your invoice.   IF you received labwork today, you will receive an invoice from [facility]. Please contact [facility] at [phone] with questions or concerns regarding your invoice.   Our billing staff will not be able to assist you with questions regarding bills from these companies.  You will be contacted with the lab results as soon as they are available. The fastest way to get your results is to activate your My Chart account. Instructions are located on the last page of this paperwork. If you have not heard from us regarding the results in  2 weeks, please contact this office.      

## 2015-06-02 NOTE — Progress Notes (Signed)
06/02/2015 10:42 AM   DOB: 08-11-1960 / MRN: [id number]  SUBJECTIVE:  Theresa Bell is a 55 y.o. female presenting for for the evaluation of sore throat that started 5 days ago.  She denies congestion and cough. Treatments tried thus far include several OTC preps with fair to poor relief. She reports sick contacts.  She is allergic to codeine; darvocet; and darvon.   She  has a past medical history of Hypertension; Back pain; Allergy; and Blood transfusion without reported diagnosis.    She  reports that she has quit smoking. Her smoking use included Cigarettes. She started smoking about 32 years ago. She has never used smokeless tobacco. She reports that she drinks about 2.5 oz of alcohol per week. She reports that she does not use illicit drugs. She  has no sexual activity history on file. The patient  has past surgical history that includes Tubal ligation; Cholecystectomy; Colonoscopy; Polypectomy; and Temporomandibular joint arthroscopy.  Her family history includes Breast cancer in her paternal aunt; Cancer in her father; Colon cancer in her paternal grandmother; Hyperlipidemia in her father; Hypertension in her father; Uterine cancer in her maternal grandmother.  Review of Systems  Constitutional: Negative for fever.  Respiratory: Negative for hemoptysis, shortness of breath and wheezing.   Cardiovascular: Negative for chest pain and leg swelling.  Gastrointestinal: Negative for nausea.  Skin: Negative for rash.  Neurological: Negative for dizziness and headaches.    Problem list and medications reviewed and updated by myself where necessary, and exist elsewhere in the encounter.   OBJECTIVE:  BP 126/72 mmHg  Pulse 106  Temp(Src) 98.9 F (37.2 C)  Resp 18  Ht  (1.626 m)  Wt 168 lb (76.204 kg)  BMI 28.82 kg/m2  SpO2 99%  Physical Exam  Constitutional: She is oriented to person, place, and time. She appears well-nourished. No distress.  HENT:  Right Ear: Hearing  and tympanic membrane normal.  Left Ear: Hearing and tympanic membrane normal.  Nose: Mucosal edema present. No sinus tenderness. Right sinus exhibits no maxillary sinus tenderness and no frontal sinus tenderness. Left sinus exhibits no maxillary sinus tenderness and no frontal sinus tenderness.  Mouth/Throat: Uvula is midline, oropharynx is clear and moist and mucous membranes are normal.  Eyes: EOM are normal. Pupils are equal, round, and reactive to light.  Cardiovascular: Normal rate.   Pulmonary/Chest: Effort normal.  Abdominal: She exhibits no distension.  Neurological: She is alert and oriented to person, place, and time. No cranial nerve deficit. Skin: Skin is dry. She is not diaphoretic.  Vitals reviewed.  No results found for this or any previous visit (from the past 48 hour(s)).  ASSESSMENT AND PLAN:  Theresa Bell was seen today for sore throat, cough and nasal congestion.  Diagnoses and all orders for this visit:  Viral URI with cough: Her exam is largely normal aside from a tachycardia, however she tends to run a little high.  She had a stress test two years ago and this was negative.  She denies chest pain and leg swelling.  Advised 600 mg ibuprofen q8 and Zyrtec-D for nasal congestion.  She is to contact me at day 12 of total illness and will consider other options at that time.  -     HYDROcodone-homatropine (HYCODAN) 5-1.5 MG/5ML syrup; Take 2.5-5 mLs by mouth at bedtime as needed.    The patient was advised to call or return to clinic if she does not see an improvement in symptoms or to seek  the care of the closest emergency department if she worsens with the above plan.   Deliah BostonMichael Madlynn Lundeen, MHS, PA-C [facility] 06/02/2015 10:42 AM

## 2015-06-20 ENCOUNTER — Telehealth: Payer: Self-pay

## 2015-06-20 NOTE — Telephone Encounter (Signed)
Scheduled with the wrong provider. She was scheduled with Dr Leone PayorGessner and it should have been Dr Russella DarStark. I have left her a message to call back to discuss moving her to Dr Russella DarStark or Mike GipAmy Esterwood, PAC.

## 2015-06-20 NOTE — Telephone Encounter (Signed)
The patient is aware. She will ask her employer if she can change the time she needs to be off.

## 2015-06-21 ENCOUNTER — Other Ambulatory Visit (INDEPENDENT_AMBULATORY_CARE_PROVIDER_SITE_OTHER): Payer: BLUE CROSS/BLUE SHIELD

## 2015-06-21 DIAGNOSIS — R748 Abnormal levels of other serum enzymes: Secondary | ICD-10-CM

## 2015-06-21 LAB — HEPATIC FUNCTION PANEL
ALBUMIN: 4 g/dL (ref 3.5–5.2)
ALT: 152 U/L — ABNORMAL HIGH (ref 0–35)
AST: 102 U/L — AB (ref 0–37)
Alkaline Phosphatase: 78 U/L (ref 39–117)
Bilirubin, Direct: 0.3 mg/dL (ref 0.0–0.3)
Total Bilirubin: 1.6 mg/dL — ABNORMAL HIGH (ref 0.2–1.2)
Total Protein: 7.9 g/dL (ref 6.0–8.3)

## 2015-06-26 ENCOUNTER — Encounter: Payer: Self-pay | Admitting: Physician Assistant

## 2015-06-26 ENCOUNTER — Ambulatory Visit (INDEPENDENT_AMBULATORY_CARE_PROVIDER_SITE_OTHER): Payer: BLUE CROSS/BLUE SHIELD | Admitting: Physician Assistant

## 2015-06-26 ENCOUNTER — Ambulatory Visit: Payer: BLUE CROSS/BLUE SHIELD | Admitting: Internal Medicine

## 2015-06-26 VITALS — BP 122/70 | HR 82 | Ht 64.0 in | Wt 162.1 lb

## 2015-06-26 DIAGNOSIS — R945 Abnormal results of liver function studies: Principal | ICD-10-CM

## 2015-06-26 DIAGNOSIS — R7989 Other specified abnormal findings of blood chemistry: Secondary | ICD-10-CM

## 2015-06-26 NOTE — Progress Notes (Signed)
Patient ID: Theresa Bell, female   DOB: 1960/07/25, 55 y.o.   MRN: [id number]   Subjective:    Patient ID: Theresa Bell, female    DOB: Aug 12, 1960, 55 y.o.   MRN: [id number]  HPI  Theresa Bell is a pleasant 55 year old white female known to Dr. Russella Dar who was last seen in the office in February 2017. She is being followed for persistent transaminitis and had had an abnormal MRI of liver. Most recent MRI done 03/23/2015 showed a prominent left lobe of the liver, no cirrhosis she does have mild steatosis and a subtle T2 hyperintense lesion in the central area of segment 2 measuring 9 x 8 mm and less conspicuous then prior exam. This felt consistent with benign etiology, differential atypical hemangioma or an area of focal nodular hyperplasia She has had mildly elevated transaminases over the past couple of years and in 2015 had undergone workup with chronic hepatitis studies which were negative and autoimmune markers all done and unrevealing. Labs done every 2017 total bili 2.2 1.5 ST 65 ALT of 88 These were recently repeated on 06/21/2015 T bili 1.6, alkaline phosphatase 78, AST of 102 and ALT 152  At the time of last office visit he was suggested she come off of Cozaar and because of the hemangioma we discussed stopping hormone therapy. She comes intake today for follow-up and says she feels fine. She has been off of Cozaar over the past month off [facility] for about a month as well  Review of Systems Pertinent positive and negative review of systems were noted in the above HPI section.  All other review of systems was otherwise negative.  Outpatient Encounter Prescriptions as of 06/26/2015  Medication Sig  . calcium carbonate (TUMS - DOSED IN MG ELEMENTAL CALCIUM) 500 MG chewable tablet Chew 1 tablet by mouth 2 (two) times daily.  . fexofenadine (ALLEGRA) 180 MG tablet Take 180 mg by mouth daily.  Marland Kitchen triamterene-hydrochlorothiazide (MAXZIDE-25) 37.5-25 MG tablet Take one half tablet daily  .  [DISCONTINUED] estradiol-norethindrone (MIMVEY) 1-0.5 MG tablet Take 1 tablet by mouth daily. Reported on 06/02/2015  . [DISCONTINUED] HYDROcodone-homatropine (HYCODAN) 5-1.5 MG/5ML syrup Take 2.5-5 mLs by mouth at bedtime as needed.  . [DISCONTINUED] losartan (COZAAR) 25 MG tablet Take 1 tablet daily (Patient not taking: Reported on 06/02/2015)   No facility-administered encounter medications on file as of 06/26/2015.   Allergies  Allergen Reactions  . Codeine     REACTION: Nausea  . Darvocet [Propoxyphene N-Acetaminophen]   . Darvon    Patient Active Problem List   Diagnosis Date Noted  . Allergic reaction 05/15/2014  . Erosion and ectropion of cervix 08/23/2013  . Hypertension 04/26/2011  . Unspecified vitamin D deficiency 04/26/2011  . Hormone replacement therapy (postmenopausal) 04/26/2011  . TRANSAMINASES, SERUM, ELEVATED 02/12/2009  . ABNORMAL FINDINGS GI TRACT 02/12/2009   Social History   Social History  . Marital Status: Married    Spouse Name: N/A  . Number of Children: N/A  . 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 Smoking status: Former Smoker    Types: Cigarettes    Start date: 11/10/1982  . Smokeless tobacco: Never Used  . Alcohol Use: 2.5 oz/week    5 drink(s) per week  . Drug Use: No  . Sexual Activity: Not on file   Other Topics Concern  . Not on file   Social History Narrative    [name]'s family history includes Breast cancer in her paternal aunt; Colon cancer in her paternal grandmother; Hyperlipidemia in her father; Hypertension in her father; Lung cancer in her father; Uterine cancer in her maternal grandmother.      Objective:    Filed Vitals:   06/26/15 1026  BP: 122/70  Pulse: 82    Physical Exam  well-developed white female in no acute distress, pleasant blood pressure 122/70 pulse 82 height 5 foot 4 weight 162. HEENT; nontraumatic normocephalic EOMI PERRLA  sclera anicteric, Cardiovascular ;regular rate and rhythm with S1-S2 no murmur or gallop, Pulmonary; clear bilaterally Abd; soft nontender nondistended bowel sounds are active no palpable mass or hepatosplenomegaly, Neuropsych; mood and affect appropriate     Assessment & Plan:   #1 55 yo female with Persistent mild transaminitis of unclear etiology Prior autoimmune workup negative as were hepatitis serologies. She has mild steatosis on MRI. Possible offending medications have been discontinued #2 small stable hepatic lesion on MRI, atypical hemangioma versus an area of focal nodular hyperplasia #3 status post cholecystectomy #4 positive family history of colon cancer-last colonoscopy October 2013 finding of mild diverticulosis only, she will be due for follow-up 2018  Plan; we discussed possible need for liver biopsy Also [facility] and she would like to pursue this. We will arrange consultation. Plan follow-up hepatic panel just prior to that visit   Amy Oswald HillockS Esterwood PA-C 06/26/2015   Cc: Collene Gobbleaub, Steven A, MD

## 2015-06-26 NOTE — Patient Instructions (Signed)
We will contact [facility] and contact you later.

## 2015-06-26 NOTE — Progress Notes (Signed)
Reviewed and agree with management plan.  Malcolm T. Stark, MD FACG 

## 2015-07-02 ENCOUNTER — Telehealth: Payer: Self-pay

## 2015-07-02 NOTE — Telephone Encounter (Signed)
[facility]  - appointment is 08/20/15 at 1:30 pm

## 2015-08-20 DIAGNOSIS — R748 Abnormal levels of other serum enzymes: Secondary | ICD-10-CM | POA: Diagnosis not present

## 2015-08-20 DIAGNOSIS — E7889 Other lipoprotein metabolism disorders: Secondary | ICD-10-CM | POA: Diagnosis not present

## 2015-08-24 ENCOUNTER — Other Ambulatory Visit (HOSPITAL_COMMUNITY): Payer: Self-pay | Admitting: Nurse Practitioner

## 2015-08-24 DIAGNOSIS — K769 Liver disease, unspecified: Secondary | ICD-10-CM

## 2015-08-24 DIAGNOSIS — K7581 Nonalcoholic steatohepatitis (NASH): Secondary | ICD-10-CM

## 2015-09-06 ENCOUNTER — Ambulatory Visit (HOSPITAL_COMMUNITY)
Admission: RE | Admit: 2015-09-06 | Discharge: 2015-09-06 | Disposition: A | Discharge status: Home / Self Care | Payer: BLUE CROSS/BLUE SHIELD | Source: Ambulatory Visit | Attending: Nurse Practitioner | Admitting: Nurse Practitioner

## 2015-09-06 DIAGNOSIS — R160 Hepatomegaly, not elsewhere classified: Secondary | ICD-10-CM | POA: Insufficient documentation

## 2015-09-06 DIAGNOSIS — Z9049 Acquired absence of other specified parts of digestive tract: Secondary | ICD-10-CM | POA: Insufficient documentation

## 2015-09-06 DIAGNOSIS — K769 Liver disease, unspecified: Secondary | ICD-10-CM

## 2015-09-06 DIAGNOSIS — K7581 Nonalcoholic steatohepatitis (NASH): Secondary | ICD-10-CM | POA: Diagnosis not present

## 2015-09-19 ENCOUNTER — Ambulatory Visit (INDEPENDENT_AMBULATORY_CARE_PROVIDER_SITE_OTHER): Payer: BLUE CROSS/BLUE SHIELD | Admitting: Emergency Medicine

## 2015-09-19 VITALS — BP 122/80 | HR 102 | Temp 98.4°F | Resp 18 | Ht 64.0 in | Wt 149.0 lb

## 2015-09-19 DIAGNOSIS — R945 Abnormal results of liver function studies: Secondary | ICD-10-CM

## 2015-09-19 DIAGNOSIS — K7581 Nonalcoholic steatohepatitis (NASH): Secondary | ICD-10-CM | POA: Diagnosis not present

## 2015-09-19 DIAGNOSIS — I10 Essential (primary) hypertension: Secondary | ICD-10-CM | POA: Diagnosis not present

## 2015-09-19 DIAGNOSIS — R7989 Other specified abnormal findings of blood chemistry: Secondary | ICD-10-CM

## 2015-09-19 LAB — BASIC METABOLIC PANEL WITH GFR
BUN: 11 mg/dL (ref 7–25)
CO2: 28 mmol/L (ref 20–31)
Calcium: 9.9 mg/dL (ref 8.6–10.4)
Chloride: 101 mmol/L (ref 98–110)
Creat: 0.92 mg/dL (ref 0.50–1.05)
GFR, Est African American: 82 mL/min (ref >=60)
GFR, Est Non African American: 71 mL/min (ref >=60)
Glucose, Bld: 98 mg/dL (ref 65–99)
Potassium: 3.3 mmol/L — ABNORMAL LOW (ref 3.5–5.3)
Sodium: 140 mmol/L (ref 135–146)

## 2015-09-19 MED ORDER — TRIAMTERENE-HCTZ 37.5-25 MG PO TABS: ORAL_TABLET | ORAL | 3 refills | Status: AC

## 2015-09-19 NOTE — Patient Instructions (Signed)
     IF you received an x-ray today, you will receive an invoice from [facility]. Please contact [facility] at [phone] with questions or concerns regarding your invoice.   IF you received labwork today, you will receive an invoice from [facility]. Please contact [facility] at [phone] with questions or concerns regarding your invoice.   Our billing staff will not be able to assist you with questions regarding bills from these companies.  You will be contacted with the lab results as soon as they are available. The fastest way to get your results is to activate your My Chart account. Instructions are located on the last page of this paperwork. If you have not heard from us regarding the results in 2 weeks, please contact this office.      

## 2015-09-19 NOTE — Progress Notes (Signed)
By signing my name below, I, Raven Small, attest that this documentation has been prepared under the direction and in the presence of Lesle Chris, MD.  Electronically Signed: Andrew Au, ED Scribe. 09/19/2015. 8:16 AM.   Chief Complaint:  Chief Complaint  Patient presents with  . Medication Refill    TRIAMTERENE  . Other    DISCUSS OVERALL HEALTH    HPI: Theresa Bell is a 55 y.o. female who reports to [facility] today for a follow up. She is here today to discuss her health. She had an abnormal MRI of liver. Liver enzymes 05/2015: AST-102, ALT-152 08/20/15: AST- 54, ALT-85   Most recent ultrasound abdomen 09/06/2015  IMPRESSION: 1. Left liver lobe mass is stable to decreased in size back to 2010, consistent with a benign liver mass as described on multiple prior MRI studies. 2. No macroscopic evidence of cirrhosis. No sonographic evidence of hepatic steatosis. 3. Cholecystectomy.  No biliary ductal dilatation. 4. Hepatic elastography results:  Median hepatic shear wave velocity is calculated at 1.67 m/sec.  Corresponding Metavir fibrosis score is F2 + some F3.  Risk of fibrosis is moderate.  Follow-up:  Additional testing appropriate.  She has lost 10lbs through exercise and dietary change.  Wt Readings from Last 3 Encounters:  09/19/15 149 lb (67.6 kg)  06/26/15 162 lb 2 oz (73.5 kg)  06/02/15 168 lb (76.2 kg)   Body mass index is 25.58 kg/m.   Past Medical History:  Diagnosis Date  . Allergy   . Back pain   . Blood transfusion without reported diagnosis   . Diverticulosis   . Hypertension    Past Surgical History:  Procedure Laterality Date  . CHOLECYSTECTOMY    . COLONOSCOPY    . POLYPECTOMY    . TEMPOROMANDIBULAR JOINT ARTHROSCOPY    . TUBAL LIGATION     Social History   Social History  . Marital status: Married    Spouse name: N/A  . Number of children: N/A  . Years of education: N/A   Occupational History  . mental Financial planner   Social History Main Topics  . Smoking status: Former Smoker    Types: Cigarettes    Start date: 11/10/1982  . Smokeless tobacco: Never Used  . Alcohol use 2.5 oz/week    5 Standard drinks or equivalent per week  . Drug use: No  . Sexual activity: Not Asked   Other Topics Concern  . None   Social History Narrative  . None   Family History  Problem Relation Age of Onset  . Breast cancer Paternal Aunt   . Uterine cancer Maternal Grandmother   . Colon cancer Paternal Grandmother   . Lung cancer Father   . Hypertension Father   . Hyperlipidemia Father    Allergies  Allergen Reactions  . Codeine     REACTION: Nausea  . Darvocet [Propoxyphene N-Acetaminophen]   . Darvon    Prior to Admission medications   Medication Sig Start Date End Date Taking? Authorizing Provider  calcium carbonate (TUMS - DOSED IN MG ELEMENTAL CALCIUM) 500 MG chewable tablet Chew 1 tablet by mouth 2 (two) times daily.   Yes Historical Provider, MD  fexofenadine (ALLEGRA) 180 MG tablet Take 180 mg by mouth daily.   Yes Historical Provider, MD  triamterene-hydrochlorothiazide [facility]) 37.5-25 MG tablet Take one half tablet daily 03/01/15  Yes Collene Gobble, MD     ROS: The patient denies fevers, chills, night  sweats, unintentional weight loss, chest pain, palpitations, wheezing, dyspnea on exertion, nausea, vomiting, abdominal pain, dysuria, hematuria, melena, numbness, weakness, or tingling.   All other systems have been reviewed and were otherwise negative with the exception of those mentioned in the HPI and as above.    PHYSICAL EXAM: Vitals:   09/19/15 0809  BP: 122/80  Pulse: (!) 102  Resp: 18  Temp: 98.4 F (36.9 C)   Body mass index is 25.58 kg/m.  General: Alert, no acute distress HEENT:  Normocephalic, atraumatic, oropharynx patent. Eye: Nonie Hoyer [facility] Cardiovascular:  Regular rate and rhythm, no rubs murmurs or gallops.  No Carotid bruits,  radial pulse intact. No pedal edema.  Respiratory: Clear to auscultation bilaterally.  No wheezes, rales, or rhonchi.  No cyanosis, no use of accessory musculature Abdominal: No organomegaly, abdomen is soft and non-tender, positive bowel sounds.  No masses. Musculoskeletal: Gait intact. No edema, tenderness Skin: No rashes. Neurologic: Facial musculature symmetric. Psychiatric: Patient acts appropriately throughout our interaction. Lymphatic: No cervical or submandibular lymphadenopathy    LABS:    EKG/XRAY:   Primary read interpreted by Dr. Cleta Alberts at [facility].   ASSESSMENT/PLAN: Patient is doing great. She is on a weight loss program with exercise. There is a good chance she may get off her blood pressure medications in the future. She is currently followed at the GI clinic liver clinic at [facility]. Recent falls or sounds show decrease in size of her known liver lesion. I encouraged her to follow-up with her GYN. She's going to have  Misty Stanley I personally performed the services described in this documentation, which was scribed in my presence. The recorded information has been reviewed and is accurate. Hyacinth Meeker as her PCP.   Gross sideeffects, risk and benefits, and alternatives of medications d/w patient. Patient is aware that all medications have potential sideeffects and we are unable to predict every sideeffect or drug-drug interaction that may occur.  Lesle Chris MD 09/19/2015 8:15 AM

## 2015-09-20 ENCOUNTER — Telehealth: Payer: Self-pay | Admitting: Emergency Medicine

## 2015-09-20 DIAGNOSIS — E876 Hypokalemia: Secondary | ICD-10-CM

## 2015-09-20 MED ORDER — POTASSIUM CHLORIDE ER 20 MEQ PO TBCR: 20.0000 meq | EXTENDED_RELEASE_TABLET | Freq: Every day | ORAL | 5 refills | Status: DC

## 2015-09-20 NOTE — Telephone Encounter (Signed)
-----   Message from Collene Gobble, MD sent at 09/20/2015  7:45 AM EDT ----- Call patient. Her potassium is low. Call-in K-Lor con 20 mEq 1 a day. #30 tablets. Refill 5.. Repeat potassium in 3 months. Put in a future order for 3 months.

## 2015-09-28 ENCOUNTER — Telehealth: Payer: Self-pay

## 2015-09-28 ENCOUNTER — Other Ambulatory Visit: Payer: Self-pay | Admitting: Emergency Medicine

## 2015-09-28 DIAGNOSIS — E2839 Other primary ovarian failure: Secondary | ICD-10-CM

## 2015-09-28 NOTE — Telephone Encounter (Signed)
Ok to order. Pended

## 2015-09-28 NOTE — Telephone Encounter (Signed)
The patient called to request an order for a bone density exam with [facility].  Her last test was done in August 2015.  She is having her mammogram done in late August and would like add the bone density exam to her scheduled appointment, if possible.  Please advise, thank you.

## 2015-09-28 NOTE — Telephone Encounter (Signed)
Okay to sign bone density request

## 2015-09-28 NOTE — Telephone Encounter (Signed)
Ok signed.

## 2015-10-18 DIAGNOSIS — Z803 Family history of malignant neoplasm of breast: Secondary | ICD-10-CM | POA: Diagnosis not present

## 2015-10-18 DIAGNOSIS — M899 Disorder of bone, unspecified: Secondary | ICD-10-CM | POA: Diagnosis not present

## 2015-10-18 DIAGNOSIS — Z1231 Encounter for screening mammogram for malignant neoplasm of breast: Secondary | ICD-10-CM | POA: Diagnosis not present

## 2015-11-09 DIAGNOSIS — Z23 Encounter for immunization: Secondary | ICD-10-CM | POA: Diagnosis not present

## 2015-12-03 DIAGNOSIS — I10 Essential (primary) hypertension: Secondary | ICD-10-CM | POA: Diagnosis not present

## 2015-12-03 DIAGNOSIS — J301 Allergic rhinitis due to pollen: Secondary | ICD-10-CM | POA: Diagnosis not present

## 2015-12-03 DIAGNOSIS — R7303 Prediabetes: Secondary | ICD-10-CM | POA: Diagnosis not present

## 2015-12-03 DIAGNOSIS — R160 Hepatomegaly, not elsewhere classified: Secondary | ICD-10-CM | POA: Diagnosis not present

## 2015-12-26 DIAGNOSIS — I10 Essential (primary) hypertension: Secondary | ICD-10-CM | POA: Diagnosis not present

## 2015-12-26 DIAGNOSIS — E785 Hyperlipidemia, unspecified: Secondary | ICD-10-CM | POA: Diagnosis not present

## 2015-12-26 DIAGNOSIS — R7303 Prediabetes: Secondary | ICD-10-CM | POA: Diagnosis not present

## 2016-01-01 DIAGNOSIS — R875 Abnormal microbiological findings in specimens from female genital organs: Secondary | ICD-10-CM | POA: Diagnosis not present

## 2016-01-01 DIAGNOSIS — Z6823 Body mass index (BMI) 23.0-23.9, adult: Secondary | ICD-10-CM | POA: Diagnosis not present

## 2016-01-01 DIAGNOSIS — Z01419 Encounter for gynecological examination (general) (routine) without abnormal findings: Secondary | ICD-10-CM | POA: Diagnosis not present

## 2016-01-21 DIAGNOSIS — K529 Noninfective gastroenteritis and colitis, unspecified: Secondary | ICD-10-CM | POA: Diagnosis not present

## 2016-02-12 DIAGNOSIS — K740 Hepatic fibrosis: Secondary | ICD-10-CM | POA: Diagnosis not present

## 2016-02-12 DIAGNOSIS — K7581 Nonalcoholic steatohepatitis (NASH): Secondary | ICD-10-CM | POA: Diagnosis not present

## 2016-04-14 DIAGNOSIS — H5213 Myopia, bilateral: Secondary | ICD-10-CM | POA: Diagnosis not present

## 2016-08-06 DIAGNOSIS — I10 Essential (primary) hypertension: Secondary | ICD-10-CM | POA: Diagnosis not present

## 2016-08-08 DIAGNOSIS — K740 Hepatic fibrosis: Secondary | ICD-10-CM | POA: Diagnosis not present

## 2016-08-08 DIAGNOSIS — K7581 Nonalcoholic steatohepatitis (NASH): Secondary | ICD-10-CM | POA: Diagnosis not present

## 2016-08-11 ENCOUNTER — Other Ambulatory Visit (HOSPITAL_COMMUNITY): Payer: Self-pay | Admitting: Nurse Practitioner

## 2016-08-11 DIAGNOSIS — K7581 Nonalcoholic steatohepatitis (NASH): Secondary | ICD-10-CM

## 2016-08-28 ENCOUNTER — Ambulatory Visit (HOSPITAL_COMMUNITY)
Admission: RE | Admit: 2016-08-28 | Discharge: 2016-08-28 | Disposition: A | Discharge status: Home / Self Care | Payer: BLUE CROSS/BLUE SHIELD | Source: Ambulatory Visit | Attending: Nurse Practitioner | Admitting: Nurse Practitioner

## 2016-08-28 DIAGNOSIS — K7689 Other specified diseases of liver: Secondary | ICD-10-CM | POA: Diagnosis not present

## 2016-08-28 DIAGNOSIS — K7581 Nonalcoholic steatohepatitis (NASH): Secondary | ICD-10-CM | POA: Insufficient documentation

## 2016-10-13 DIAGNOSIS — G44209 Tension-type headache, unspecified, not intractable: Secondary | ICD-10-CM | POA: Diagnosis not present

## 2016-10-13 DIAGNOSIS — M9901 Segmental and somatic dysfunction of cervical region: Secondary | ICD-10-CM | POA: Diagnosis not present

## 2016-10-13 DIAGNOSIS — M542 Cervicalgia: Secondary | ICD-10-CM | POA: Diagnosis not present

## 2016-10-13 DIAGNOSIS — M62838 Other muscle spasm: Secondary | ICD-10-CM | POA: Diagnosis not present

## 2016-10-15 DIAGNOSIS — M9901 Segmental and somatic dysfunction of cervical region: Secondary | ICD-10-CM | POA: Diagnosis not present

## 2016-10-15 DIAGNOSIS — G44209 Tension-type headache, unspecified, not intractable: Secondary | ICD-10-CM | POA: Diagnosis not present

## 2016-10-15 DIAGNOSIS — M62838 Other muscle spasm: Secondary | ICD-10-CM | POA: Diagnosis not present

## 2016-10-15 DIAGNOSIS — M542 Cervicalgia: Secondary | ICD-10-CM | POA: Diagnosis not present

## 2016-10-21 DIAGNOSIS — Z1231 Encounter for screening mammogram for malignant neoplasm of breast: Secondary | ICD-10-CM | POA: Diagnosis not present

## 2016-12-22 ENCOUNTER — Encounter: Payer: Self-pay | Admitting: Gastroenterology

## 2017-01-01 DIAGNOSIS — J029 Acute pharyngitis, unspecified: Secondary | ICD-10-CM | POA: Diagnosis not present

## 2017-01-01 DIAGNOSIS — H6503 Acute serous otitis media, bilateral: Secondary | ICD-10-CM | POA: Diagnosis not present

## 2017-01-01 DIAGNOSIS — R05 Cough: Secondary | ICD-10-CM | POA: Diagnosis not present

## 2017-01-01 DIAGNOSIS — H6121 Impacted cerumen, right ear: Secondary | ICD-10-CM | POA: Diagnosis not present

## 2017-01-27 DIAGNOSIS — K740 Hepatic fibrosis: Secondary | ICD-10-CM | POA: Diagnosis not present

## 2017-01-27 DIAGNOSIS — K7581 Nonalcoholic steatohepatitis (NASH): Secondary | ICD-10-CM | POA: Diagnosis not present

## 2017-01-30 DIAGNOSIS — Z1151 Encounter for screening for human papillomavirus (HPV): Secondary | ICD-10-CM | POA: Diagnosis not present

## 2017-01-30 DIAGNOSIS — Z01419 Encounter for gynecological examination (general) (routine) without abnormal findings: Secondary | ICD-10-CM | POA: Diagnosis not present

## 2017-01-30 DIAGNOSIS — Z6826 Body mass index (BMI) 26.0-26.9, adult: Secondary | ICD-10-CM | POA: Diagnosis not present

## 2017-02-04 ENCOUNTER — Encounter: Payer: Self-pay | Admitting: Gastroenterology

## 2017-04-06 ENCOUNTER — Other Ambulatory Visit: Payer: Self-pay

## 2017-04-06 ENCOUNTER — Ambulatory Visit (AMBULATORY_SURGERY_CENTER): Payer: Self-pay | Admitting: *Deleted

## 2017-04-06 VITALS — Ht 64.0 in | Wt 160.8 lb

## 2017-04-06 DIAGNOSIS — Z800 Family history of malignant neoplasm of digestive organs: Secondary | ICD-10-CM

## 2017-04-06 MED ORDER — NA SULFATE-K SULFATE-MG SULF 17.5-3.13-1.6 GM/177ML PO SOLN: 1.0000 [IU] | Freq: Once | ORAL | 0 refills | Status: AC

## 2017-04-06 NOTE — Progress Notes (Signed)
No egg or soy allergy known to patient  Pt. Had issues with past anesthesia with TMJ and Cholecystectomy surgeries severe vomiting , had propofol 2013 with last colon and did fine. no intubation problems  No diet pills per patient No home 02 use per patient  No blood thinners per patient  Pt denies issues with constipation  No A fib or A flutter  EMMI video sent to pt's e mail

## 2017-04-07 ENCOUNTER — Encounter: Payer: Self-pay | Admitting: Gastroenterology

## 2017-04-20 ENCOUNTER — Ambulatory Visit (AMBULATORY_SURGERY_CENTER): Payer: BLUE CROSS/BLUE SHIELD | Admitting: Gastroenterology

## 2017-04-20 ENCOUNTER — Other Ambulatory Visit: Payer: Self-pay

## 2017-04-20 ENCOUNTER — Encounter: Payer: Self-pay | Admitting: Gastroenterology

## 2017-04-20 VITALS — BP 137/75 | HR 76 | Temp 99.3°F | Resp 17 | Ht 64.0 in | Wt 160.0 lb

## 2017-04-20 DIAGNOSIS — Z800 Family history of malignant neoplasm of digestive organs: Secondary | ICD-10-CM | POA: Diagnosis not present

## 2017-04-20 DIAGNOSIS — Z1211 Encounter for screening for malignant neoplasm of colon: Secondary | ICD-10-CM | POA: Diagnosis not present

## 2017-04-20 MED ORDER — SODIUM CHLORIDE 0.9 % IV SOLN: 500.0000 mL | INTRAVENOUS | Status: DC

## 2017-04-20 NOTE — Progress Notes (Signed)
To recovery, report to RN, VSS. 

## 2017-04-20 NOTE — Progress Notes (Signed)
Pt's states no medical or surgical changes since previsit or office visit. 

## 2017-04-20 NOTE — Patient Instructions (Signed)
YOU HAD AN ENDOSCOPIC PROCEDURE TODAY AT THE [facility]:   Refer to the procedure report that was given to you for any specific questions about what was found during the examination.  If the procedure report does not answer your questions, please call your gastroenterologist to clarify.  If you requested that your care partner not be given the details of your procedure findings, then the procedure report has been included in a sealed envelope for you to review at your convenience later.  YOU SHOULD EXPECT: Some feelings of bloating in the abdomen. Passage of more gas than usual.  Walking can help get rid of the air that was put into your GI tract during the procedure and reduce the bloating. If you had a lower endoscopy (such as a colonoscopy or flexible sigmoidoscopy) you may notice spotting of blood in your stool or on the toilet paper. If you underwent a bowel prep for your procedure, you may not have a normal bowel movement for a few days.  Please Note:  You might notice some irritation and congestion in your nose or some drainage.  This is from the oxygen used during your procedure.  There is no need for concern and it should clear up in a day or so.  SYMPTOMS TO REPORT IMMEDIATELY:   Following lower endoscopy (colonoscopy or flexible sigmoidoscopy):  Excessive amounts of blood in the stool  Significant tenderness or worsening of abdominal pains  Swelling of the abdomen that is new, acute  Fever of 100F or higher  For urgent or emergent issues, a gastroenterologist can be reached at any hour by calling [phone].   DIET:  We do recommend a small meal at first, but then you may proceed to your regular diet.  Drink plenty of fluids but you should avoid alcoholic beverages for 24 hours.  MEDICATIONS: Continue present medications.  Please see handouts given to you by your recovery nurse.  ACTIVITY:  You should plan to take it easy for the rest of today and you should NOT  DRIVE or use heavy machinery until tomorrow (because of the sedation medicines used during the test).    FOLLOW UP: Our staff will call the number listed on your records the next business day following your procedure to check on you and address any questions or concerns that you may have regarding the information given to you following your procedure. If we do not reach you, we will leave a message.  However, if you are feeling well and you are not experiencing any problems, there is no need to return our call.  We will assume that you have returned to your regular daily activities without incident.  If any biopsies were taken you will be contacted by phone or by letter within the next 1-3 weeks.  Please call us at [phone] if you have not heard about the biopsies in 3 weeks.   Thank you for allowing us to provide for your healthcare needs today.   SIGNATURES/CONFIDENTIALITY: You and/or your care partner have signed paperwork which will be entered into your electronic medical record.  These signatures attest to the fact that that the information above on your After Visit Summary has been reviewed and is understood.  Full responsibility of the confidentiality of this discharge information lies with you and/or your care-partner. 

## 2017-04-20 NOTE — Op Note (Signed)
[facility] Patient Name: Theresa Bell Procedure Date: 04/20/2017 11:52 AM MRN: [id number] Endoscopist: Meryl Dare , MD Age: 57 Referring MD:  Date of Birth: [birth year] Gender: Female Account #: [id number] Procedure:                Colonoscopy Indications:              Colon cancer screening in patient at increased                            risk: Family history of colorectal cancer in                            multiple 2nd degree relatives Medicines:                Monitored Anesthesia Care Procedure:                Pre-Anesthesia Assessment:                           - Prior to the procedure, a History and Physical                            was performed, and patient medications and                            allergies were reviewed. The patient's tolerance of                            previous anesthesia was also reviewed. The risks                            and benefits of the procedure and the sedation                            options and risks were discussed with the patient.                            All questions were answered, and informed consent                            was obtained. Prior Anticoagulants: The patient has                            taken no previous anticoagulant or antiplatelet                            agents. ASA Grade Assessment: II - A patient with                            mild systemic disease. After reviewing the risks                            and benefits, the patient was deemed in  satisfactory condition to undergo the procedure.                           After obtaining informed consent, the colonoscope                            was passed under direct vision. Throughout the                            procedure, the patient's blood pressure, pulse, and                            oxygen saturations were monitored continuously. The                            Model PCF-[id number])  scope was introduced                            through the anus and advanced to the the cecum,                            identified by appendiceal orifice and ileocecal                            valve. The ileocecal valve, appendiceal orifice,                            and rectum were photographed. The quality of the                            bowel preparation was excellent. The colonoscopy                            was performed without difficulty. The patient                            tolerated the procedure well. Scope In: 11:57:46 AM Scope Out: 12:08:12 PM Scope Withdrawal Time: 0 hours 8 minutes 47 seconds  Total Procedure Duration: 0 hours 10 minutes 26 seconds  Findings:                 The perianal and digital rectal examinations were                            normal.                           A few small-mouthed diverticula were found in the                            sigmoid colon.                           The exam was otherwise without abnormality on  direct and retroflexion views. Complications:            No immediate complications. Estimated blood loss:                            None. Estimated Blood Loss:     Estimated blood loss: none. Impression:               - Diverticulosis in the sigmoid colon.                           - The examination was otherwise normal on direct                            and retroflexion views.                           - No specimens collected. Recommendation:           - Repeat colonoscopy in 5 years for screening                            purposes.                           - Patient has a contact number available for                            emergencies. The signs and symptoms of potential                            delayed complications were discussed with the                            patient. Return to normal activities tomorrow.                            Written discharge instructions were  provided to the                            patient.                           - High fiber diet.                           - Continue present medications.                           - Await pathology results. Meryl DareMalcolm T Karmelo Bass, MD 04/20/2017 12:11:00 PM This report has been signed electronically.

## 2017-04-21 ENCOUNTER — Telehealth: Payer: Self-pay

## 2017-04-21 NOTE — Telephone Encounter (Signed)
Left message

## 2017-04-21 NOTE — Telephone Encounter (Signed)
NO ANSWER, MESSAGE LEFT FOR PATIENT. 

## 2017-04-27 DIAGNOSIS — H5213 Myopia, bilateral: Secondary | ICD-10-CM | POA: Diagnosis not present

## 2017-05-29 DIAGNOSIS — M79644 Pain in right finger(s): Secondary | ICD-10-CM | POA: Diagnosis not present

## 2017-05-29 DIAGNOSIS — M65831 Other synovitis and tenosynovitis, right forearm: Secondary | ICD-10-CM | POA: Diagnosis not present

## 2017-05-29 DIAGNOSIS — M654 Radial styloid tenosynovitis [de Quervain]: Secondary | ICD-10-CM | POA: Diagnosis not present

## 2017-06-29 DIAGNOSIS — M65831 Other synovitis and tenosynovitis, right forearm: Secondary | ICD-10-CM | POA: Diagnosis not present

## 2017-06-29 DIAGNOSIS — M79641 Pain in right hand: Secondary | ICD-10-CM | POA: Diagnosis not present

## 2017-08-05 DIAGNOSIS — I10 Essential (primary) hypertension: Secondary | ICD-10-CM | POA: Diagnosis not present

## 2017-08-05 DIAGNOSIS — E663 Overweight: Secondary | ICD-10-CM | POA: Diagnosis not present

## 2017-08-05 DIAGNOSIS — Z1159 Encounter for screening for other viral diseases: Secondary | ICD-10-CM | POA: Diagnosis not present

## 2017-08-26 DIAGNOSIS — Z1159 Encounter for screening for other viral diseases: Secondary | ICD-10-CM | POA: Diagnosis not present

## 2017-08-26 DIAGNOSIS — I10 Essential (primary) hypertension: Secondary | ICD-10-CM | POA: Diagnosis not present

## 2017-09-23 DIAGNOSIS — M65831 Other synovitis and tenosynovitis, right forearm: Secondary | ICD-10-CM | POA: Diagnosis not present

## 2017-09-23 DIAGNOSIS — M79641 Pain in right hand: Secondary | ICD-10-CM | POA: Diagnosis not present

## 2017-10-22 DIAGNOSIS — Z1231 Encounter for screening mammogram for malignant neoplasm of breast: Secondary | ICD-10-CM | POA: Diagnosis not present

## 2017-12-02 DIAGNOSIS — Z23 Encounter for immunization: Secondary | ICD-10-CM | POA: Diagnosis not present

## 2018-03-11 DIAGNOSIS — K7581 Nonalcoholic steatohepatitis (NASH): Secondary | ICD-10-CM | POA: Diagnosis not present

## 2018-04-14 DIAGNOSIS — Z01419 Encounter for gynecological examination (general) (routine) without abnormal findings: Secondary | ICD-10-CM | POA: Diagnosis not present

## 2018-04-14 DIAGNOSIS — Z6828 Body mass index (BMI) 28.0-28.9, adult: Secondary | ICD-10-CM | POA: Diagnosis not present

## 2018-05-10 DIAGNOSIS — H5213 Myopia, bilateral: Secondary | ICD-10-CM | POA: Diagnosis not present

## 2018-08-30 DIAGNOSIS — Z20828 Contact with and (suspected) exposure to other viral communicable diseases: Secondary | ICD-10-CM | POA: Diagnosis not present

## 2018-08-30 DIAGNOSIS — I10 Essential (primary) hypertension: Secondary | ICD-10-CM | POA: Diagnosis not present

## 2018-09-01 DIAGNOSIS — Z20828 Contact with and (suspected) exposure to other viral communicable diseases: Secondary | ICD-10-CM | POA: Diagnosis not present

## 2018-10-28 DIAGNOSIS — I10 Essential (primary) hypertension: Secondary | ICD-10-CM | POA: Diagnosis not present

## 2018-11-10 DIAGNOSIS — Z1231 Encounter for screening mammogram for malignant neoplasm of breast: Secondary | ICD-10-CM | POA: Diagnosis not present

## 2019-02-02 DIAGNOSIS — M79641 Pain in right hand: Secondary | ICD-10-CM | POA: Diagnosis not present

## 2019-02-02 DIAGNOSIS — M65831 Other synovitis and tenosynovitis, right forearm: Secondary | ICD-10-CM | POA: Diagnosis not present

## 2019-04-05 DIAGNOSIS — M25572 Pain in left ankle and joints of left foot: Secondary | ICD-10-CM | POA: Diagnosis not present

## 2019-04-05 DIAGNOSIS — M79672 Pain in left foot: Secondary | ICD-10-CM | POA: Diagnosis not present

## 2019-05-26 DIAGNOSIS — H5213 Myopia, bilateral: Secondary | ICD-10-CM | POA: Diagnosis not present

## 2019-06-09 DIAGNOSIS — Z6827 Body mass index (BMI) 27.0-27.9, adult: Secondary | ICD-10-CM | POA: Diagnosis not present

## 2019-06-09 DIAGNOSIS — Z01419 Encounter for gynecological examination (general) (routine) without abnormal findings: Secondary | ICD-10-CM | POA: Diagnosis not present

## 2019-06-10 DIAGNOSIS — M25572 Pain in left ankle and joints of left foot: Secondary | ICD-10-CM | POA: Diagnosis not present

## 2019-06-27 DIAGNOSIS — Z13820 Encounter for screening for osteoporosis: Secondary | ICD-10-CM | POA: Diagnosis not present

## 2019-07-01 DIAGNOSIS — M25572 Pain in left ankle and joints of left foot: Secondary | ICD-10-CM | POA: Diagnosis not present

## 2019-07-21 DIAGNOSIS — M25572 Pain in left ankle and joints of left foot: Secondary | ICD-10-CM | POA: Diagnosis not present

## 2019-09-26 DIAGNOSIS — K7581 Nonalcoholic steatohepatitis (NASH): Secondary | ICD-10-CM | POA: Diagnosis not present

## 2019-09-26 DIAGNOSIS — K7402 Hepatic fibrosis, advanced fibrosis: Secondary | ICD-10-CM | POA: Diagnosis not present

## 2019-10-17 DIAGNOSIS — K7581 Nonalcoholic steatohepatitis (NASH): Secondary | ICD-10-CM | POA: Diagnosis not present

## 2019-11-16 DIAGNOSIS — Z1231 Encounter for screening mammogram for malignant neoplasm of breast: Secondary | ICD-10-CM | POA: Diagnosis not present

## 2019-11-28 ENCOUNTER — Other Ambulatory Visit (HOSPITAL_COMMUNITY): Payer: Self-pay | Admitting: Nurse Practitioner

## 2019-11-28 DIAGNOSIS — L7211 Pilar cyst: Secondary | ICD-10-CM | POA: Diagnosis not present

## 2019-11-28 DIAGNOSIS — R748 Abnormal levels of other serum enzymes: Secondary | ICD-10-CM

## 2019-12-15 ENCOUNTER — Ambulatory Visit (HOSPITAL_COMMUNITY): Payer: BLUE CROSS/BLUE SHIELD

## 2019-12-21 ENCOUNTER — Other Ambulatory Visit: Payer: Self-pay | Admitting: Radiology

## 2019-12-22 ENCOUNTER — Other Ambulatory Visit: Payer: Self-pay

## 2019-12-22 ENCOUNTER — Encounter (HOSPITAL_COMMUNITY): Payer: Self-pay

## 2019-12-22 ENCOUNTER — Other Ambulatory Visit: Payer: Self-pay | Admitting: Physician Assistant

## 2019-12-22 ENCOUNTER — Ambulatory Visit (HOSPITAL_COMMUNITY)
Admission: RE | Admit: 2019-12-22 | Discharge: 2019-12-22 | Disposition: A | Discharge status: Home / Self Care | Payer: BC Managed Care – PPO | Source: Ambulatory Visit | Attending: Nurse Practitioner | Admitting: Nurse Practitioner

## 2019-12-22 DIAGNOSIS — R7989 Other specified abnormal findings of blood chemistry: Secondary | ICD-10-CM | POA: Diagnosis not present

## 2019-12-22 DIAGNOSIS — R748 Abnormal levels of other serum enzymes: Secondary | ICD-10-CM

## 2019-12-22 DIAGNOSIS — K7581 Nonalcoholic steatohepatitis (NASH): Secondary | ICD-10-CM | POA: Insufficient documentation

## 2019-12-22 DIAGNOSIS — Z6826 Body mass index (BMI) 26.0-26.9, adult: Secondary | ICD-10-CM | POA: Insufficient documentation

## 2019-12-22 DIAGNOSIS — Z885 Allergy status to narcotic agent status: Secondary | ICD-10-CM | POA: Diagnosis not present

## 2019-12-22 DIAGNOSIS — R634 Abnormal weight loss: Secondary | ICD-10-CM | POA: Insufficient documentation

## 2019-12-22 DIAGNOSIS — K7402 Hepatic fibrosis, advanced fibrosis: Secondary | ICD-10-CM | POA: Diagnosis not present

## 2019-12-22 DIAGNOSIS — K7400 Hepatic fibrosis, unspecified: Secondary | ICD-10-CM | POA: Diagnosis not present

## 2019-12-22 DIAGNOSIS — R945 Abnormal results of liver function studies: Secondary | ICD-10-CM | POA: Diagnosis not present

## 2019-12-22 DIAGNOSIS — Z87891 Personal history of nicotine dependence: Secondary | ICD-10-CM | POA: Insufficient documentation

## 2019-12-22 DIAGNOSIS — Z880 Allergy status to penicillin: Secondary | ICD-10-CM | POA: Insufficient documentation

## 2019-12-22 DIAGNOSIS — K759 Inflammatory liver disease, unspecified: Secondary | ICD-10-CM | POA: Diagnosis not present

## 2019-12-22 LAB — CBC
HCT: 43.6 % (ref 36.0–46.0)
Hemoglobin: 14.3 g/dL (ref 12.0–15.0)
MCH: 27.2 pg (ref 26.0–34.0)
MCHC: 32.8 g/dL (ref 30.0–36.0)
MCV: 83 fL (ref 80.0–100.0)
Platelets: 288 10*3/uL (ref 150–400)
RBC: 5.25 MIL/uL — ABNORMAL HIGH (ref 3.87–5.11)
RDW: 13.8 % (ref 11.5–15.5)
WBC: 6 10*3/uL (ref 4.0–10.5)
nRBC: 0 % (ref 0.0–0.2)

## 2019-12-22 LAB — PROTIME-INR
INR: 1 (ref 0.8–1.2)
Prothrombin Time: 13 seconds (ref 11.4–15.2)

## 2019-12-22 MED ORDER — ACETAMINOPHEN 325 MG PO TABS
ORAL_TABLET | ORAL | Status: AC
  Filled 2019-12-22: qty 2

## 2019-12-22 MED ORDER — FENTANYL CITRATE (PF) 100 MCG/2ML IJ SOLN
INTRAMUSCULAR | Status: AC
  Filled 2019-12-22: qty 2

## 2019-12-22 MED ORDER — LIDOCAINE HCL (PF) 1 % IJ SOLN
INTRAMUSCULAR | Status: AC
  Filled 2019-12-22: qty 30

## 2019-12-22 MED ORDER — FENTANYL CITRATE (PF) 100 MCG/2ML IJ SOLN
INTRAMUSCULAR | Status: AC | PRN
Start: 2019-12-22 — End: 2019-12-22
  Administered 2019-12-22: 25 ug via INTRAVENOUS
  Administered 2019-12-22: 50 ug via INTRAVENOUS
  Administered 2019-12-22: 25 ug via INTRAVENOUS

## 2019-12-22 MED ORDER — GELATIN ABSORBABLE 12-7 MM EX MISC
CUTANEOUS | Status: AC
  Filled 2019-12-22: qty 1

## 2019-12-22 MED ORDER — MIDAZOLAM HCL 2 MG/2ML IJ SOLN
INTRAMUSCULAR | Status: AC
  Filled 2019-12-22: qty 2

## 2019-12-22 MED ORDER — ACETAMINOPHEN 325 MG PO TABS
650.0000 mg | ORAL_TABLET | Freq: Once | ORAL | Status: AC
  Administered 2019-12-22: 650 mg via ORAL
  Filled 2019-12-22: qty 2

## 2019-12-22 MED ORDER — SODIUM CHLORIDE 0.9 % IV SOLN
INTRAVENOUS | Status: AC | PRN
  Administered 2019-12-22: 10 mL/h via INTRAVENOUS

## 2019-12-22 MED ORDER — MIDAZOLAM HCL 2 MG/2ML IJ SOLN
INTRAMUSCULAR | Status: AC | PRN
  Administered 2019-12-22 (×2): 0.5 mg via INTRAVENOUS
  Administered 2019-12-22: 1 mg via INTRAVENOUS

## 2019-12-22 MED ORDER — SODIUM CHLORIDE 0.9 % IV SOLN: INTRAVENOUS | Status: DC

## 2019-12-22 NOTE — Procedures (Signed)
Interventional Radiology Procedure:   Indications: Elevated liver enzymes  Procedure: US guided liver biopsy  Findings: 3 cores from right hepatic lobe  Complications: None     EBL: less than 10 ml  Plan: Bedrest 3 hours   Aleatha Taite R. Derrel Moore, MD  Pager: [phone]      

## 2019-12-22 NOTE — Sedation Documentation (Signed)
Attempted to call report. Nurse unavailable, will call back.

## 2019-12-22 NOTE — Discharge Instructions (Addendum)
Liver Biopsy, Care After These instructions give you information on caring for yourself after your procedure. Your doctor may also give you more specific instructions. Call your doctor if you have any problems or questions after your procedure. What can I expect after the procedure? After the procedure, it is common to have:  Pain and soreness where the biopsy was done.  Bruising around the area where the biopsy was done.  Sleepiness and be tired for a few days. Follow these instructions at home: Medicines  Take over-the-counter and prescription medicines only as told by your doctor.  If you were prescribed an antibiotic medicine, take it as told by your doctor. Do not stop taking the antibiotic even if you start to feel better.  Do not take medicines such as aspirin and ibuprofen. These medicines can thin your blood. Do not take these medicines unless your doctor tells you to take them.  If you are taking prescription pain medicine, take actions to prevent or treat constipation. Your doctor may recommend that you: ? Drink enough fluid to keep your pee (urine) clear or pale yellow. ? Take over-the-counter or prescription medicines. ? Eat foods that are high in fiber, such as fresh fruits and vegetables, whole grains, and beans. ? Limit foods that are high in fat and processed sugars, such as fried and sweet foods. Caring for your cut  Follow instructions from your doctor about how to take care of your cuts from surgery (incisions). Make sure you: ? Wash your hands with soap and water before you change your bandage (dressing). If you cannot use soap and water, use hand sanitizer. ? Remove your bandage as told by your doctor. In 24 hours  Check your cuts every day for signs of infection. Check for: ? Redness, swelling, or more pain. ? Fluid or blood. ? Pus or a bad smell. ? Warmth.  Do not take baths, swim, or use a hot tub until your doctor says it is okay to do  so. Activity   Rest at home for 1-2 days or as told by your doctor. ? Avoid sitting for a long time without moving. Get up to take short walks every 1-2 hours.  Return to your normal activities as told by your doctor. Ask what activities are safe for you.  Do not do these things in the first 24 hours: ? Drive. ? Use machinery. ? Take a bath or shower.  Do not lift more than 10 pounds (4.5 kg) or play contact sports for the first 2 weeks. General instructions   Do not drink alcohol in the first week after the procedure.  Have someone stay with you for at least 24 hours after the procedure.  Get your test results. Ask your doctor or the department that is doing the test: ? When will my results be ready? ? How will I get my results? ? What are my treatment options? ? What other tests do I need? ? What are my next steps?  Keep all follow-up visits as told by your doctor. This is important. Contact a doctor if:  A cut bleeds and leaves more than just a small spot of blood.  A cut is red, puffs up (swells), or hurts more than before.  Fluid or something else comes from a cut.  A cut smells bad.  You have a fever or chills. Get help right away if:  You have swelling, bloating, or pain in your belly (abdomen).  You get dizzy or faint.    You have a rash.  You feel sick to your stomach (nauseous) or throw up (vomit).  You have trouble breathing, feel short of breath, or feel faint.  Your chest hurts.  You have problems talking or seeing.  You have trouble with your balance or moving your arms or legs. Summary  After the procedure, it is common to have pain, soreness, bruising, and tiredness.  Your doctor will tell you how to take care of yourself at home. Change your bandage, take your medicines, and limit your activities as told by your doctor.  Call your doctor if you have symptoms of infection. Get help right away if your belly swells, your cut bleeds a lot, or  you have trouble talking or breathing. This information is not intended to replace advice given to you by your health care provider. Make sure you discuss any questions you have with your health care provider. Document Revised: 02/20/2017 Document Reviewed: 02/20/2017 Elsevier Patient Education  2020 [employer]  Moderate Conscious Sedation, Adult, Care After These instructions provide you with information about caring for yourself after your procedure. Your health care provider may also give you more specific instructions. Your treatment has been planned according to current medical practices, but problems sometimes occur. Call your health care provider if you have any problems or questions after your procedure. What can I expect after the procedure? After your procedure, it is common:  To feel sleepy for several hours.  To feel clumsy and have poor balance for several hours.  To have poor judgment for several hours.  To vomit if you eat too soon. Follow these instructions at home: For at least 24 hours after the procedure:   Do not: ? Participate in activities where you could fall or become injured. ? Drive. ? Use heavy machinery. ? Drink alcohol. ? Take sleeping pills or medicines that cause drowsiness. ? Make important decisions or sign legal documents. ? Take care of children on your own.  Rest. Eating and drinking  Follow the diet recommended by your health care provider.  If you vomit: ? Drink water, juice, or soup when you can drink without vomiting. ? Make sure you have little or no nausea before eating solid foods. General instructions  Have a responsible adult stay with you until you are awake and alert.  Take over-the-counter and prescription medicines only as told by your health care provider.  If you smoke, do not smoke without supervision.  Keep all follow-up visits as told by your health care provider. This is important. Contact a health care provider  if:  You keep feeling nauseous or you keep vomiting.  You feel light-headed.  You develop a rash.  You have a fever. Get help right away if:  You have trouble breathing. This information is not intended to replace advice given to you by your health care provider. Make sure you discuss any questions you have with your health care provider. Document Revised: 01/23/2017 Document Reviewed: 06/02/2015 Elsevier Patient Education  2020 [employer].  

## 2019-12-22 NOTE — H&P (Addendum)
Chief Complaint: Patient was seen in consultation today for random liver biopsy at the request of Drazek,Dawn  Referring Physician(s): Drazek,Dawn  Supervising Physician: Ruel Favors  Patient Status: [facility] - Out-pt  History of Present Illness: Theresa Bell is a 59 y.o. female   Pt aware of elevated liver functions since 2018 Follows with Annamarie Major NP  [place] 2018: ULTRASOUND HEPATIC ELASTOGRAPHY: Median hepatic shear wave velocity is calculated at 1.35 m/sec. Corresponding Metavir fibrosis score is  F2 + some F3. Risk of fibrosis is Moderate.  20 lb wt loss over this yr Persistent elevation   Concern for seronegative autoimmune hepatitis vs burnout NASH per notes  Scheduled now for random liver biopsy  Past Medical History:  Diagnosis Date  . Allergy   . Arthritis   . Asthma   . Back pain   . Blood transfusion without reported diagnosis   . Diverticulosis   . Hypertension     Past Surgical History:  Procedure Laterality Date  . CHOLECYSTECTOMY    . COLONOSCOPY    . POLYPECTOMY    . TEMPOROMANDIBULAR JOINT ARTHROSCOPY    . TUBAL LIGATION      Allergies: Amoxicillin, Codeine, Darvocet [propoxyphene n-acetaminophen], Darvon, and Propofol  Medications: Prior to Admission medications   Medication Sig Start Date End Date Taking? Authorizing Provider  Biotin w/ Vitamins C & E (HAIR/SKIN/NAILS PO) Take 1 tablet by mouth daily.   Yes [provider]  calcium carbonate (TUMS - DOSED IN MG ELEMENTAL CALCIUM) 500 MG chewable tablet Chew 2 tablets by mouth daily as needed for indigestion. Plus calcium   Yes [provider]  Cholecalciferol (VITAMIN D3) 50 MCG (2000 UT) TABS Take 2,000 Units by mouth daily.   Yes [provider]  diclofenac Sodium (VOLTAREN) 1 % GEL Apply 2 g topically every 30 (thirty) days.   Yes [provider]  ibuprofen (ADVIL,MOTRIN) 200 MG tablet Take 200 mg by mouth daily as needed for headache.    Yes  [provider]  Loratadine (KS ALLERCLEAR PO) Take 10 mg by mouth daily.    Yes [provider]  Multiple Vitamins-Minerals (AIRBORNE) CHEW Chew 2 tablets by mouth daily.   Yes [provider]  triamterene-hydrochlorothiazide (MAXZIDE-25) 37.5-25 MG tablet Take one half tablet daily Patient taking differently: Take 0.5 tablets by mouth daily.  09/19/15  Yes Collene Gobble, MD     Family History  Problem Relation Age of Onset  . Breast cancer Paternal Aunt   . Uterine cancer Maternal Grandmother   . Colon cancer Maternal Grandmother   . Lung cancer Father   . Hypertension Father   . Hyperlipidemia Father   . Colon polyps Father   . Rectal cancer Cousin   . Esophageal cancer Neg Hx   . Stomach cancer Neg Hx     Social History   Socioeconomic History  . Marital status: Married    Spouse name: Not on file  . Number of children: Not on file  . Years of education: Not on file  . Highest education level: Not on file  Occupational History  . Occupation: mental health professional    Employer: [employer]  Tobacco Use  . Smoking status: Former Smoker    Types: Cigarettes    Start date: 11/10/1982  . Smokeless tobacco: Never Used  . Tobacco comment: quit at age 40  Vaping Use  . Vaping Use: Never used  Substance and Sexual Activity  . Alcohol use:  Yes    Alcohol/week: 1.0 standard drink    Types: 1 Standard drinks or equivalent per week  . Drug use: No  . Sexual activity: Not on file  Other Topics Concern  . Not on file  Social History Narrative  . Not on file   Social Determinants of Health   Financial Resource Strain:   . Difficulty of Paying Living Expenses: Not on file  Food Insecurity:   . Worried About Programme researcher, broadcasting/film/video in the Last Year: Not on file  . Ran Out of Food in the Last Year: Not on file  Transportation Needs:   . Lack of Transportation (Medical): Not on file  . Lack of Transportation (Non-Medical): Not on  file  Physical Activity:   . Days of Exercise per Week: Not on file  . Minutes of Exercise per Session: Not on file  Stress:   . Feeling of Stress : Not on file  Social Connections:   . Frequency of Communication with Friends and Family: Not on file  . Frequency of Social Gatherings with Friends and Family: Not on file  . Attends Religious Services: Not on file  . Active Member of Clubs or Organizations: Not on file  . Attends Banker Meetings: Not on file  . Marital Status: Not on file    Review of Systems: A 12 point ROS discussed and pertinent positives are indicated in the HPI above.  All other systems are negative.  Review of Systems  Constitutional: Negative for activity change, fatigue, fever and unexpected weight change.  Respiratory: Negative for cough and shortness of breath.   Gastrointestinal: Negative for abdominal pain.  Psychiatric/Behavioral: Negative for behavioral problems and confusion.    Vital Signs: BP 129/79   Pulse 83   Temp 97.9 F (36.6 C) (Oral)   Resp 16   Ht 5' 3.5" (1.613 m)   Wt 152 lb (68.9 kg)   SpO2 99%   BMI 26.50 kg/m   Physical Exam Vitals reviewed.  Cardiovascular:     Rate and Rhythm: Normal rate and regular rhythm.     Heart sounds: Normal heart sounds.  Pulmonary:     Effort: Pulmonary effort is normal.     Breath sounds: Normal breath sounds.  Abdominal:     Palpations: Abdomen is soft.  Musculoskeletal:        General: No swelling or deformity. Normal range of motion.     Right lower leg: No edema.     Left lower leg: No edema.  Skin:    General: Skin is warm.  Neurological:     Mental Status: She is oriented to person, place, and time.  Psychiatric:        Behavior: Behavior normal.        Thought Content: Thought content normal.        Judgment: Judgment normal.     Imaging: No results found.  Labs:  CBC: Recent Labs    12/22/19 0605  WBC 6.0  HGB 14.3  HCT 43.6  PLT 288    COAGS: No  results for input(s): INR, APTT in the last 8760 hours.  BMP: No results for input(s): NA, K, CL, CO2, GLUCOSE, BUN, CALCIUM, CREATININE, GFRNONAA, GFRAA in the last 8760 hours.  Invalid input(s): CMP  LIVER FUNCTION TESTS: No results for input(s): BILITOT, AST, ALT, ALKPHOS, PROT, ALBUMIN in the last 8760 hours.  TUMOR MARKERS: No results for input(s): AFPTM, CEA, CA199, CHROMGRNA in the last 8760 hours.  Assessment and Plan:  Elevated liver functions Scheduled for liver core biopsy Risks and benefits of random liver biopsy was discussed with the patient and/or patient's family including, but not limited to bleeding, infection, damage to adjacent structures or low yield requiring additional tests.  All of the questions were answered and there is agreement to proceed. Consent signed and in chart.   Thank you for this interesting consult.  I greatly enjoyed meeting Theresa Bell and look forward to participating in their care.  A copy of this report was sent to the requesting provider on this date.  Electronically Signed: Robet Leu, PA-C 12/22/2019, 7:01 AM   I spent a total of  30 Minutes   in face to face in clinical consultation, greater than 50% of which was counseling/coordinating care for liver core bx

## 2019-12-27 LAB — SURGICAL PATHOLOGY

## 2020-01-02 DIAGNOSIS — K760 Fatty (change of) liver, not elsewhere classified: Secondary | ICD-10-CM | POA: Diagnosis not present

## 2020-01-04 ENCOUNTER — Other Ambulatory Visit: Payer: Self-pay | Admitting: Nurse Practitioner

## 2020-01-04 DIAGNOSIS — K7402 Hepatic fibrosis, advanced fibrosis: Secondary | ICD-10-CM

## 2020-01-05 DIAGNOSIS — L7211 Pilar cyst: Secondary | ICD-10-CM | POA: Diagnosis not present

## 2020-01-24 ENCOUNTER — Ambulatory Visit
Admission: RE | Admit: 2020-01-24 | Discharge: 2020-01-24 | Disposition: A | Discharge status: Home / Self Care | Payer: BC Managed Care – PPO | Source: Ambulatory Visit | Attending: Nurse Practitioner | Admitting: Nurse Practitioner

## 2020-01-24 DIAGNOSIS — K7402 Hepatic fibrosis, advanced fibrosis: Secondary | ICD-10-CM | POA: Diagnosis not present

## 2020-01-25 DIAGNOSIS — K7581 Nonalcoholic steatohepatitis (NASH): Secondary | ICD-10-CM | POA: Diagnosis not present

## 2020-01-25 DIAGNOSIS — R7309 Other abnormal glucose: Secondary | ICD-10-CM | POA: Diagnosis not present

## 2020-01-25 DIAGNOSIS — E559 Vitamin D deficiency, unspecified: Secondary | ICD-10-CM | POA: Diagnosis not present

## 2020-01-25 DIAGNOSIS — R7302 Impaired glucose tolerance (oral): Secondary | ICD-10-CM | POA: Diagnosis not present

## 2020-02-28 DIAGNOSIS — I10 Essential (primary) hypertension: Secondary | ICD-10-CM | POA: Diagnosis not present

## 2020-02-28 DIAGNOSIS — Z6825 Body mass index (BMI) 25.0-25.9, adult: Secondary | ICD-10-CM | POA: Diagnosis not present

## 2020-02-28 DIAGNOSIS — R7303 Prediabetes: Secondary | ICD-10-CM | POA: Diagnosis not present

## 2020-03-05 DIAGNOSIS — Z1159 Encounter for screening for other viral diseases: Secondary | ICD-10-CM | POA: Diagnosis not present

## 2020-03-09 DIAGNOSIS — Z1159 Encounter for screening for other viral diseases: Secondary | ICD-10-CM | POA: Diagnosis not present

## 2020-03-29 DIAGNOSIS — L7211 Pilar cyst: Secondary | ICD-10-CM | POA: Diagnosis not present

## 2020-04-27 DIAGNOSIS — Z23 Encounter for immunization: Secondary | ICD-10-CM | POA: Diagnosis not present

## 2020-05-03 DIAGNOSIS — L7211 Pilar cyst: Secondary | ICD-10-CM | POA: Diagnosis not present

## 2020-06-29 DIAGNOSIS — Z23 Encounter for immunization: Secondary | ICD-10-CM | POA: Diagnosis not present

## 2020-07-09 DIAGNOSIS — H52223 Regular astigmatism, bilateral: Secondary | ICD-10-CM | POA: Diagnosis not present

## 2020-07-09 DIAGNOSIS — H524 Presbyopia: Secondary | ICD-10-CM | POA: Diagnosis not present

## 2020-07-09 DIAGNOSIS — H04123 Dry eye syndrome of bilateral lacrimal glands: Secondary | ICD-10-CM | POA: Diagnosis not present

## 2020-07-09 DIAGNOSIS — H5213 Myopia, bilateral: Secondary | ICD-10-CM | POA: Diagnosis not present

## 2020-07-24 DIAGNOSIS — K7402 Hepatic fibrosis, advanced fibrosis: Secondary | ICD-10-CM | POA: Diagnosis not present

## 2020-07-24 DIAGNOSIS — K7581 Nonalcoholic steatohepatitis (NASH): Secondary | ICD-10-CM | POA: Diagnosis not present

## 2020-07-24 DIAGNOSIS — R748 Abnormal levels of other serum enzymes: Secondary | ICD-10-CM | POA: Diagnosis not present

## 2020-07-25 ENCOUNTER — Other Ambulatory Visit: Payer: Self-pay | Admitting: Nurse Practitioner

## 2020-07-25 DIAGNOSIS — K7402 Hepatic fibrosis, advanced fibrosis: Secondary | ICD-10-CM

## 2020-07-25 DIAGNOSIS — R748 Abnormal levels of other serum enzymes: Secondary | ICD-10-CM

## 2020-07-25 DIAGNOSIS — K7581 Nonalcoholic steatohepatitis (NASH): Secondary | ICD-10-CM

## 2020-07-30 DIAGNOSIS — Z20822 Contact with and (suspected) exposure to covid-19: Secondary | ICD-10-CM | POA: Diagnosis not present

## 2020-08-01 DIAGNOSIS — Z03818 Encounter for observation for suspected exposure to other biological agents ruled out: Secondary | ICD-10-CM | POA: Diagnosis not present

## 2020-08-01 DIAGNOSIS — Z20822 Contact with and (suspected) exposure to covid-19: Secondary | ICD-10-CM | POA: Diagnosis not present

## 2020-08-02 DIAGNOSIS — K7581 Nonalcoholic steatohepatitis (NASH): Secondary | ICD-10-CM | POA: Diagnosis not present

## 2020-08-06 DIAGNOSIS — Z23 Encounter for immunization: Secondary | ICD-10-CM | POA: Diagnosis not present

## 2020-08-13 ENCOUNTER — Ambulatory Visit
Admission: RE | Admit: 2020-08-13 | Discharge: 2020-08-13 | Disposition: A | Discharge status: Home / Self Care | Payer: BC Managed Care – PPO | Source: Ambulatory Visit | Attending: Nurse Practitioner | Admitting: Nurse Practitioner

## 2020-08-13 DIAGNOSIS — K7402 Hepatic fibrosis, advanced fibrosis: Secondary | ICD-10-CM

## 2020-08-13 DIAGNOSIS — R748 Abnormal levels of other serum enzymes: Secondary | ICD-10-CM

## 2020-08-13 DIAGNOSIS — K7581 Nonalcoholic steatohepatitis (NASH): Secondary | ICD-10-CM

## 2020-08-13 DIAGNOSIS — K7689 Other specified diseases of liver: Secondary | ICD-10-CM | POA: Diagnosis not present

## 2020-08-22 DIAGNOSIS — L720 Epidermal cyst: Secondary | ICD-10-CM | POA: Diagnosis not present

## 2020-08-22 DIAGNOSIS — D171 Benign lipomatous neoplasm of skin and subcutaneous tissue of trunk: Secondary | ICD-10-CM | POA: Diagnosis not present

## 2020-10-22 DIAGNOSIS — Z780 Asymptomatic menopausal state: Secondary | ICD-10-CM | POA: Diagnosis not present

## 2020-10-22 DIAGNOSIS — Z124 Encounter for screening for malignant neoplasm of cervix: Secondary | ICD-10-CM | POA: Diagnosis not present

## 2020-10-22 DIAGNOSIS — Z01419 Encounter for gynecological examination (general) (routine) without abnormal findings: Secondary | ICD-10-CM | POA: Diagnosis not present

## 2020-10-22 DIAGNOSIS — Z01411 Encounter for gynecological examination (general) (routine) with abnormal findings: Secondary | ICD-10-CM | POA: Diagnosis not present

## 2020-11-06 ENCOUNTER — Telehealth: Payer: Self-pay | Admitting: Gastroenterology

## 2020-11-06 NOTE — Telephone Encounter (Signed)
Left message for patient to call back and let Dr. Russella Dar know the age of her mother at diagnosis.

## 2020-11-06 NOTE — Telephone Encounter (Signed)
Patient called  to let Dr. Russella Dar know that her mother was diagnosed with colon cancer last year she is not sure if that will change her recall date.

## 2020-11-07 NOTE — Telephone Encounter (Signed)
Her recall colonoscopy was recommended for 5 years, 03/2022, and this remains the recommendation.

## 2020-11-07 NOTE — Telephone Encounter (Signed)
Called pt to inquire further. States her mother was 37 at the time of diagnosis. Further added her mother underwent partial colectomy without need for further oncological treatment. Advised I will forward this information along to Dr. Russella Dar and await his response.

## 2020-11-07 NOTE — Telephone Encounter (Signed)
Called pt and informed about Dr. Ardell Isaacs response below. Verbalized acceptance and understanding.

## 2020-11-14 DIAGNOSIS — H1130 Conjunctival hemorrhage, unspecified eye: Secondary | ICD-10-CM | POA: Diagnosis not present

## 2020-11-21 DIAGNOSIS — Z1231 Encounter for screening mammogram for malignant neoplasm of breast: Secondary | ICD-10-CM | POA: Diagnosis not present

## 2020-12-28 IMAGING — US US ABDOMEN LIMITED
1 series · 14 of 25 positions shown · non-contrast
Comparison: Abdominal ultrasound dated August 28, 2016.

CLINICAL DATA: Advanced hepatic fibrosis.

EXAM:
ULTRASOUND ABDOMEN LIMITED RIGHT UPPER QUADRANT

[Series 1: us abdomen limited · 0.22mm/px · 14 of 47 slices shown]
[im 1/47]
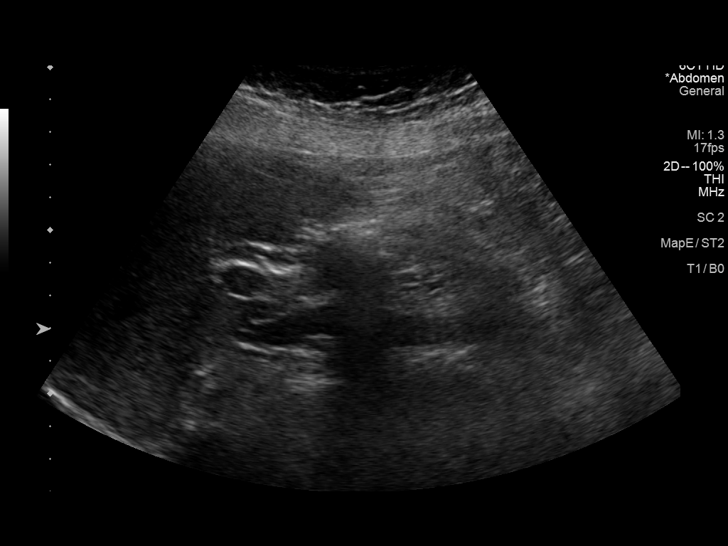
[im 4/47]
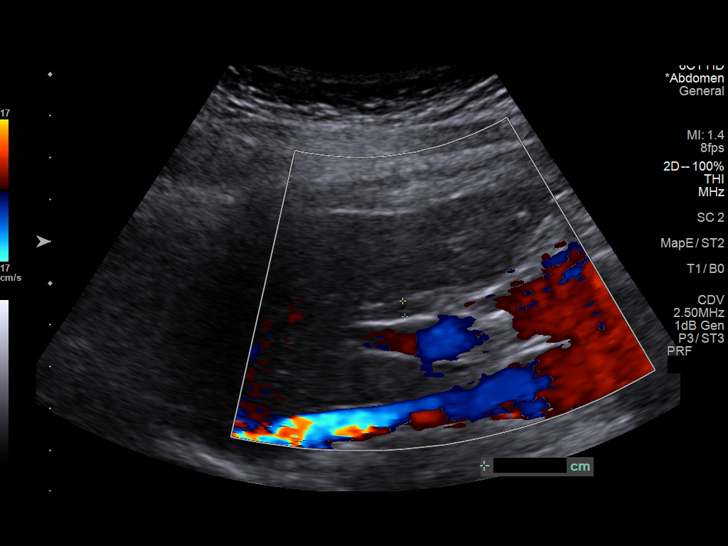
[im 8/47]
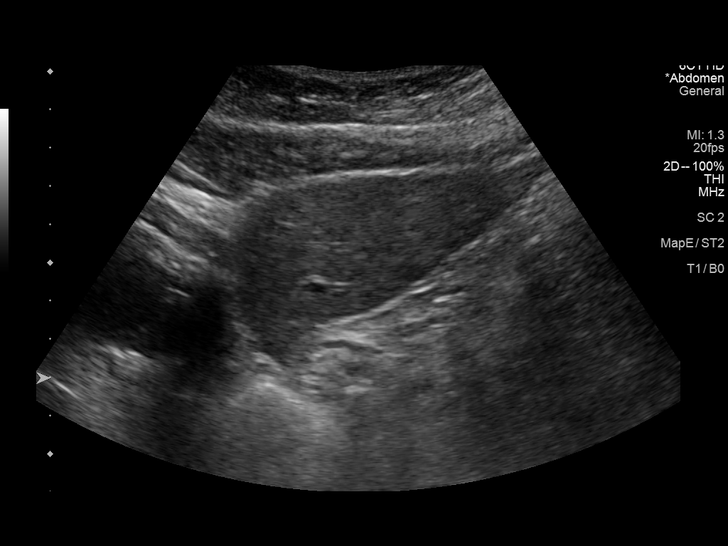
[im 12/47]
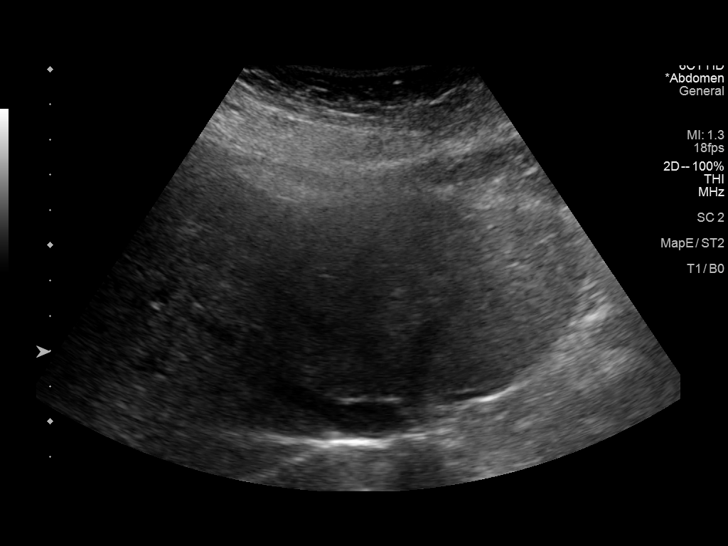
[im 16/47]
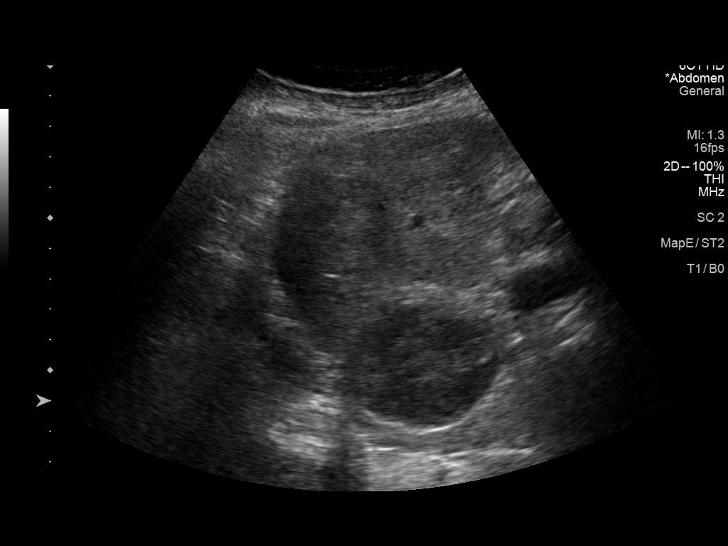
[im 18/47]
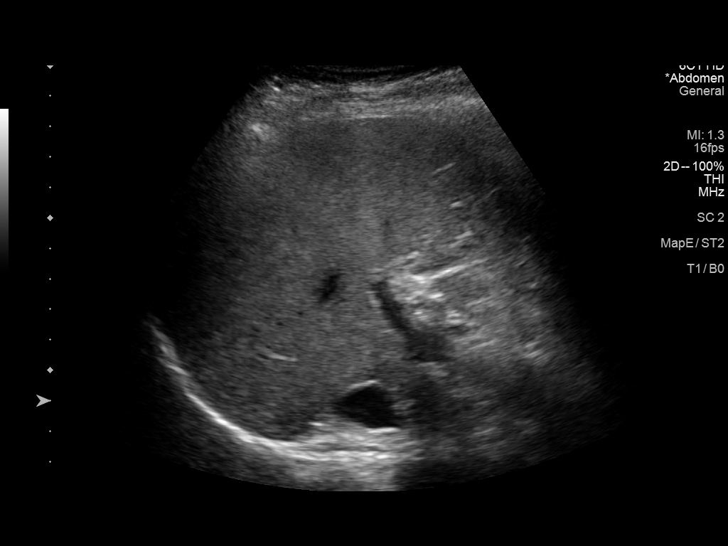
[im 22/47]
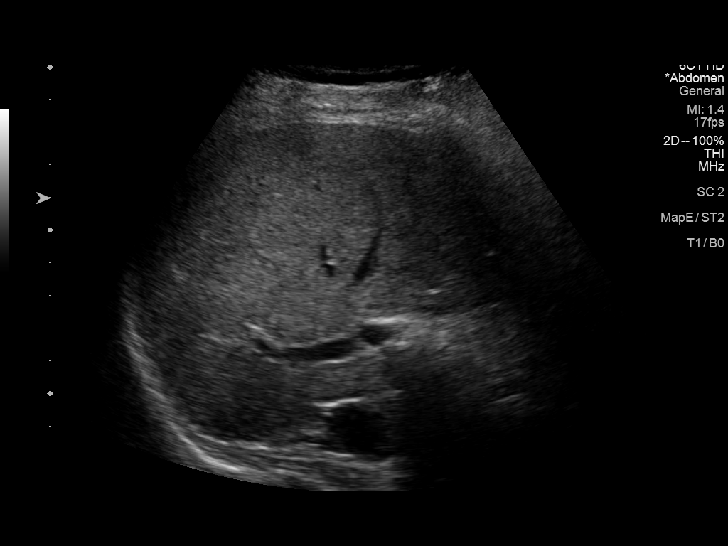
[im 25/47]
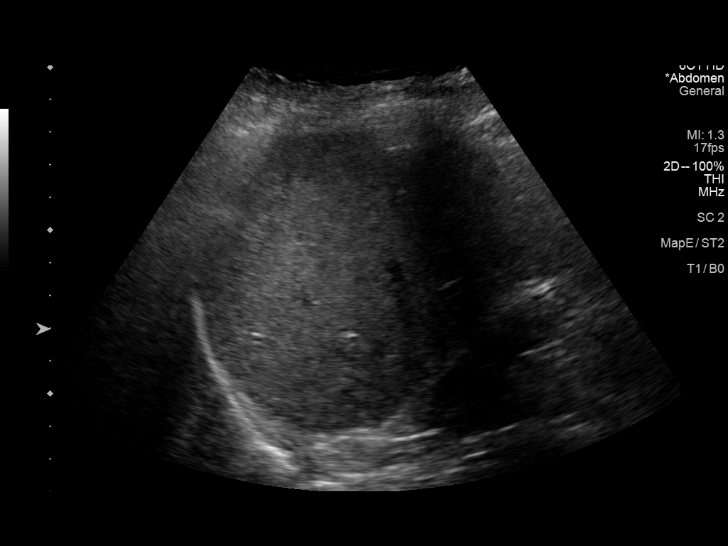
[im 29/47]
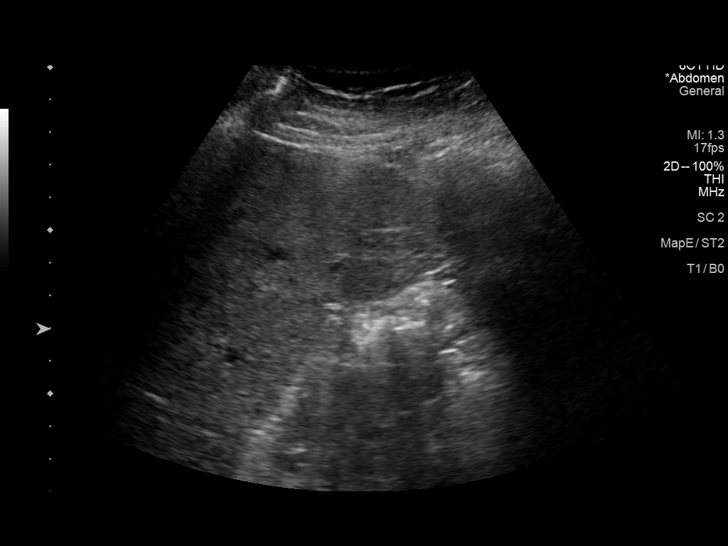
[im 31/47]
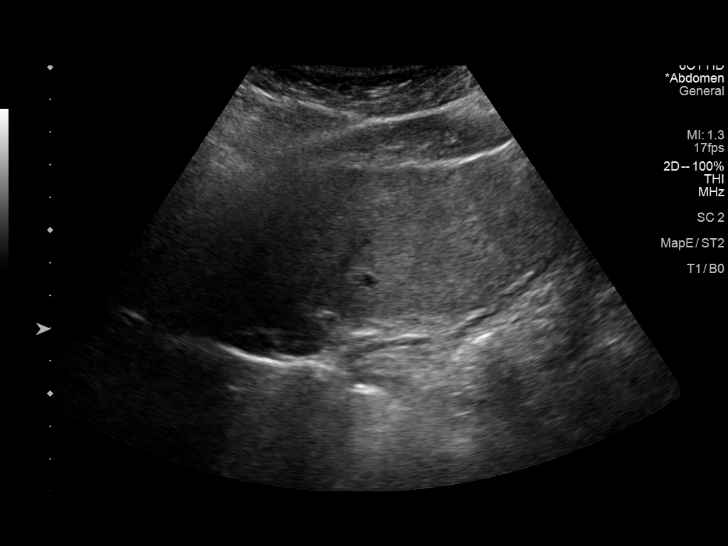
[im 35/47]
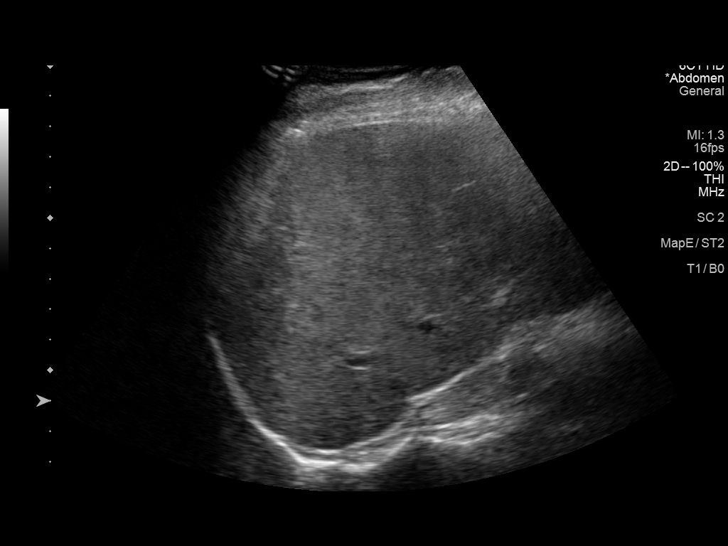
[im 39/47]
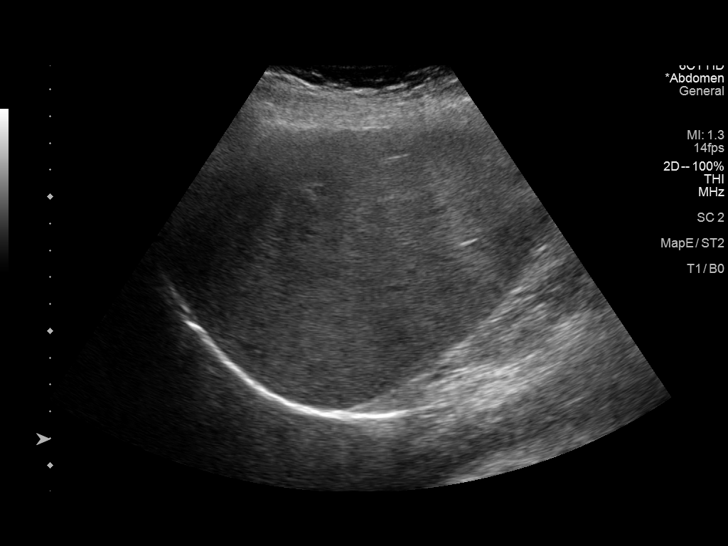
[im 43/47]
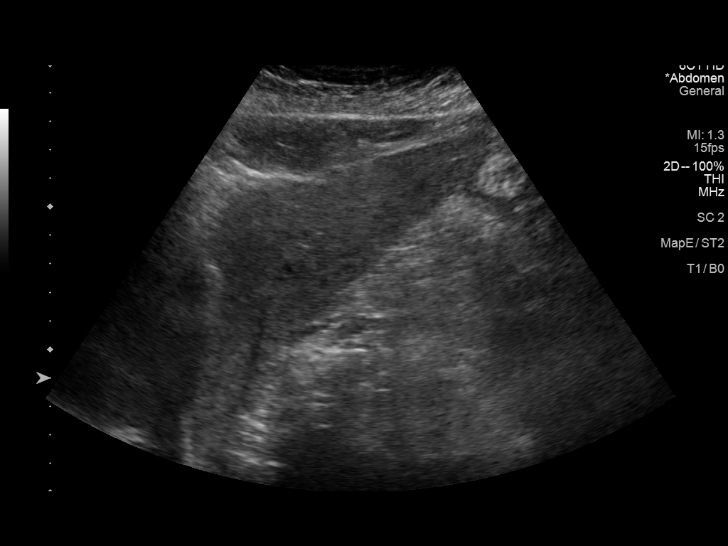
[im 47/47]
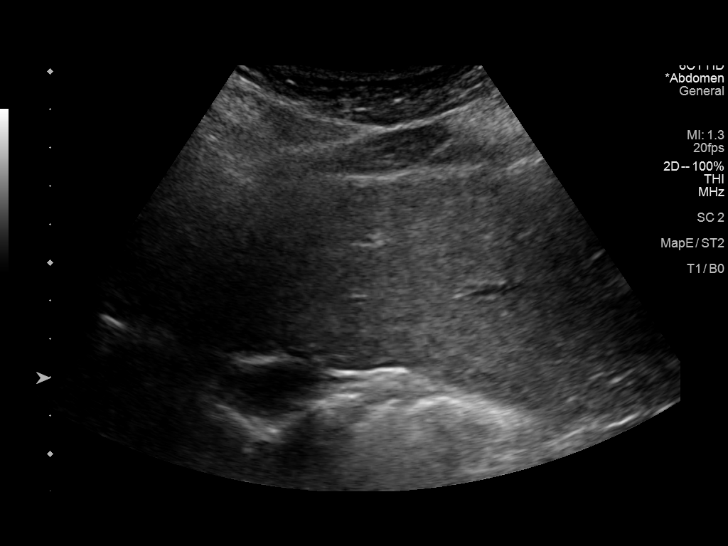

[14 of 25 positions shown; findings below may reference images not displayed]

FINDINGS: Gallbladder:

Surgically absent.

Common bile duct:

Diameter: 4 mm, normal.

Liver:

No focal lesion identified. Previously seen small 1.1 cm hyperechoic
lesion in the left hepatic lobe is not well visualized on today's
study. Coarsened, heterogeneously increased parenchymal
echogenicity. Portal vein is patent on color Doppler imaging with
normal direction of blood flow towards the liver.

Other: None.
IMPRESSION: 1. Heterogeneously increased parenchymal echogenicity, nonspecific,
but consistent with underlying liver disease.
2. Previously seen small 1.1 cm hyperechoic lesion in the left
hepatic lobe is not well visualized on today's study.

## 2021-01-30 ENCOUNTER — Other Ambulatory Visit: Payer: Self-pay | Admitting: Nurse Practitioner

## 2021-01-30 DIAGNOSIS — K7581 Nonalcoholic steatohepatitis (NASH): Secondary | ICD-10-CM

## 2021-01-30 DIAGNOSIS — K7402 Hepatic fibrosis, advanced fibrosis: Secondary | ICD-10-CM

## 2021-02-01 DIAGNOSIS — K7581 Nonalcoholic steatohepatitis (NASH): Secondary | ICD-10-CM | POA: Diagnosis not present

## 2021-02-01 DIAGNOSIS — K7402 Hepatic fibrosis, advanced fibrosis: Secondary | ICD-10-CM | POA: Diagnosis not present

## 2021-02-19 ENCOUNTER — Ambulatory Visit
Admission: RE | Admit: 2021-02-19 | Discharge: 2021-02-19 | Disposition: A | Discharge status: Home / Self Care | Payer: BC Managed Care – PPO | Source: Ambulatory Visit | Attending: Nurse Practitioner | Admitting: Nurse Practitioner

## 2021-02-19 DIAGNOSIS — K7402 Hepatic fibrosis, advanced fibrosis: Secondary | ICD-10-CM

## 2021-02-19 DIAGNOSIS — K7581 Nonalcoholic steatohepatitis (NASH): Secondary | ICD-10-CM

## 2021-07-18 IMAGING — US US ABDOMEN LIMITED
1 series · 14 of 25 positions shown · non-contrast
Comparison: 01/24/2020 abdomen and pelvis CT dated 06/27/2009

CLINICAL DATA: Non alcoholic steatohepatitis with advanced hepatic
fibrosis and elevated liver enzymes. HCC screening.

EXAM:
ULTRASOUND ABDOMEN LIMITED RIGHT UPPER QUADRANT

[Series 1: us abdomen limited · 0.20mm/px · 14 of 40 slices shown]
[im 1/40]
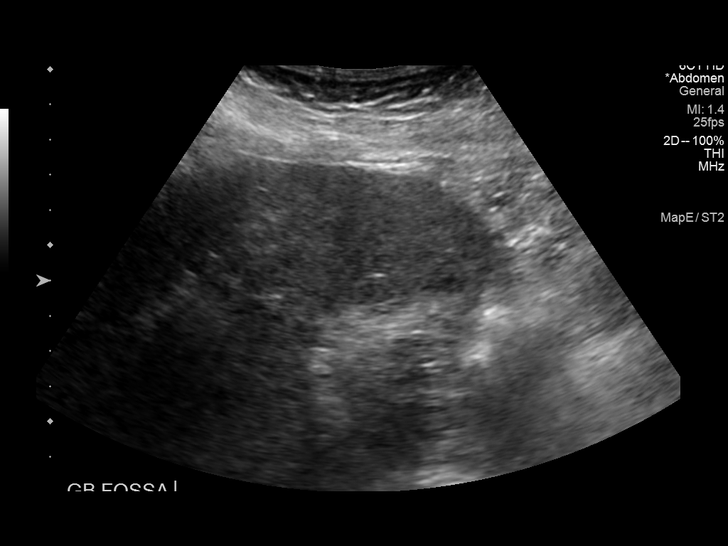
[im 4/40]
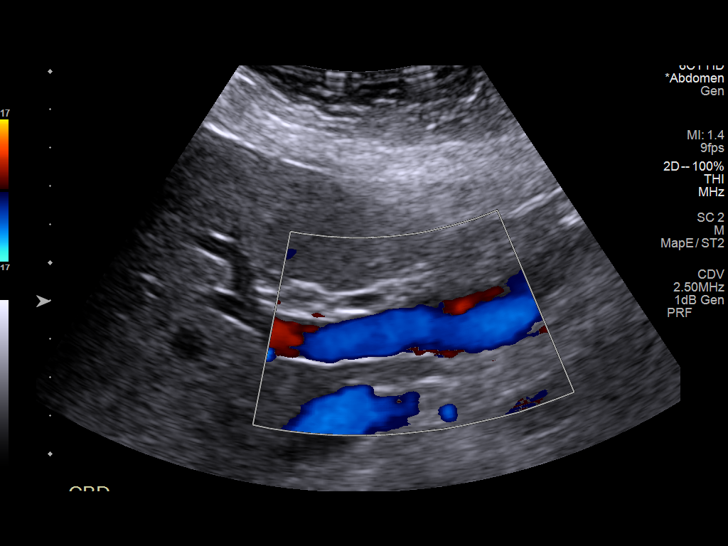
[im 7/40]
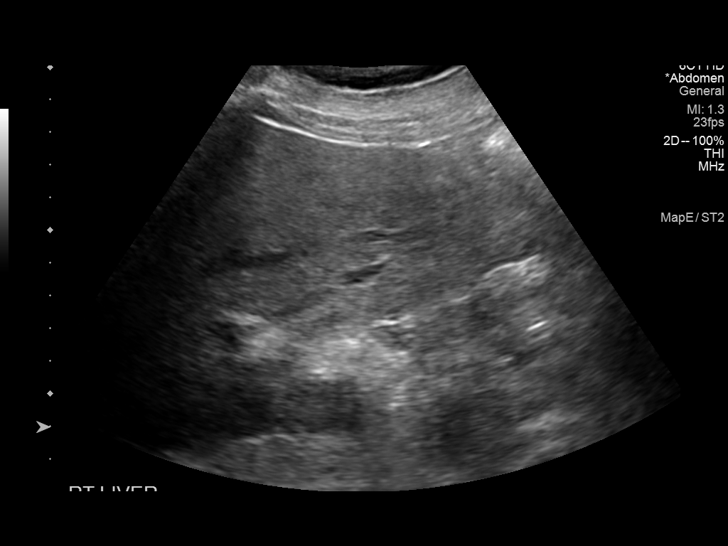
[im 10/40]
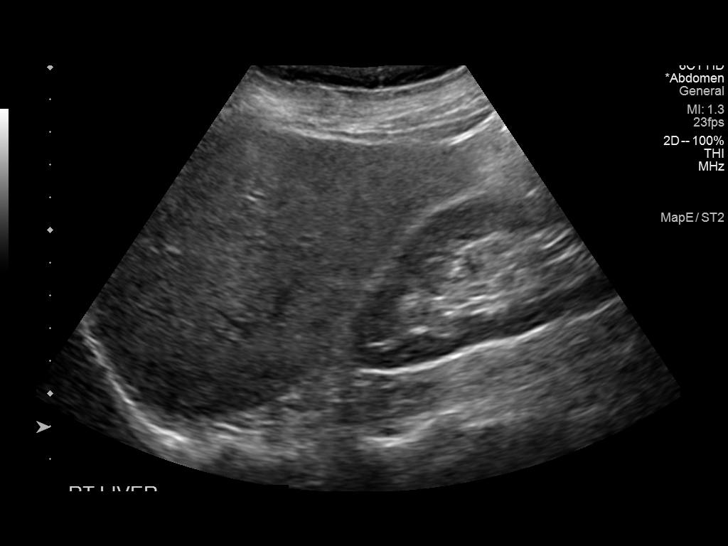
[im 14/40]
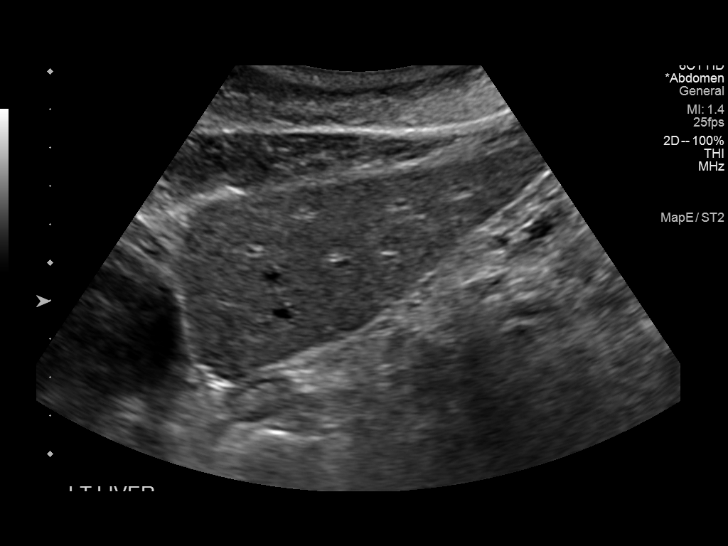
[im 15/40]
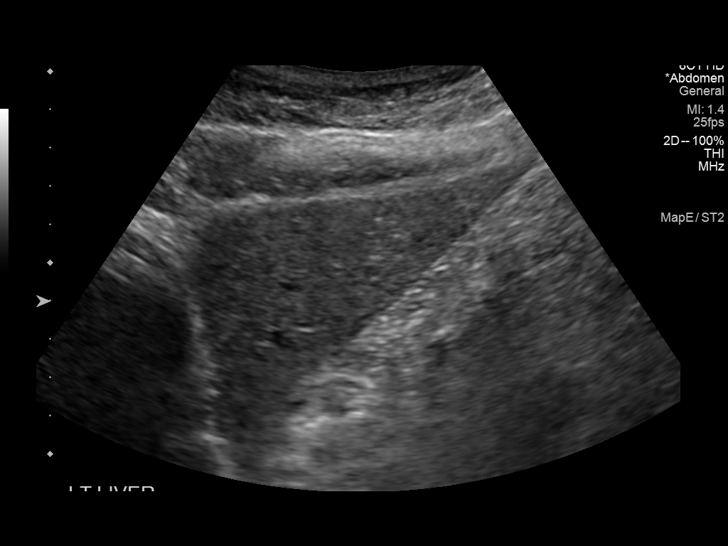
[im 18/40]
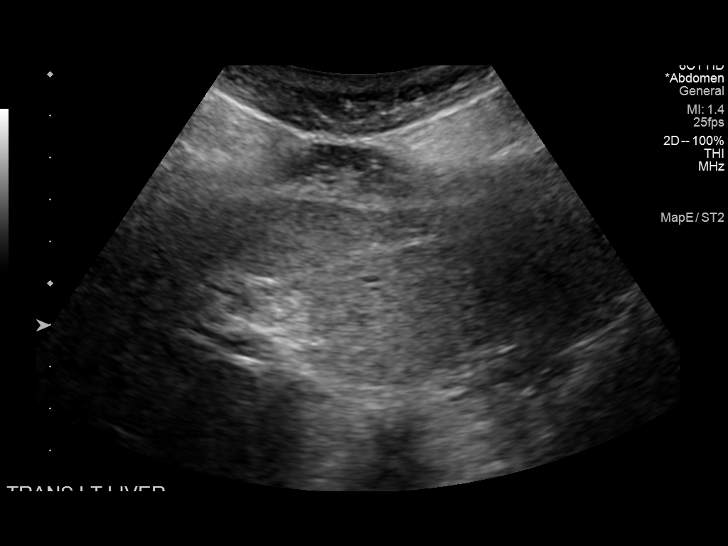
[im 22/40]
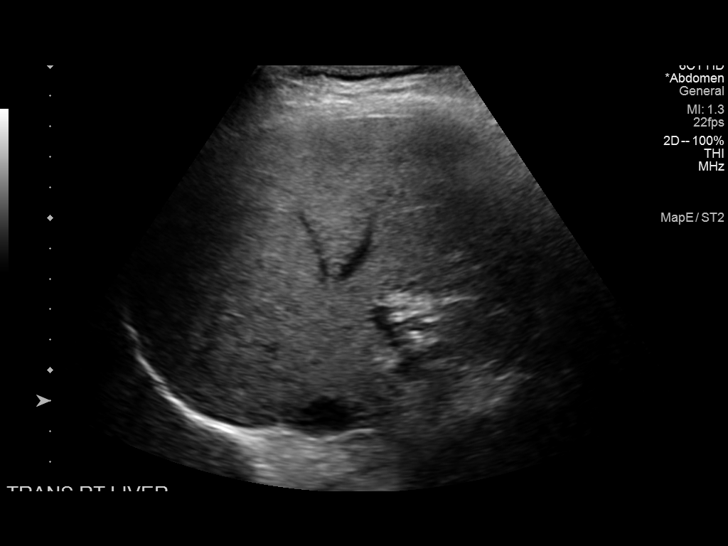
[im 25/40]
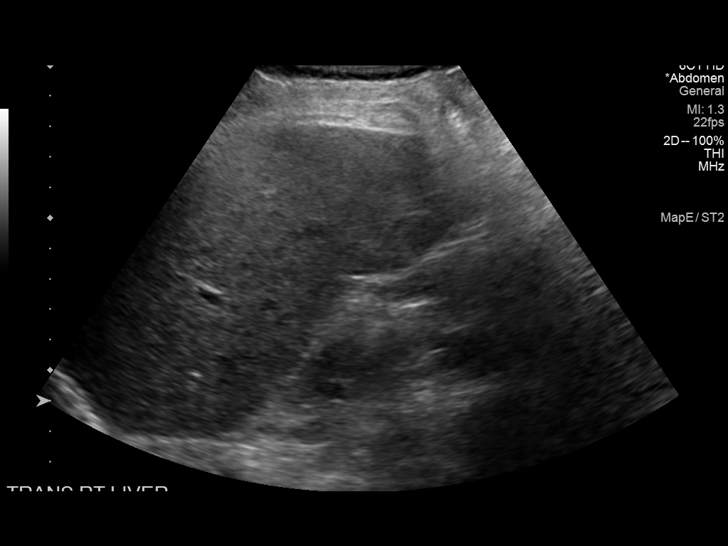
[im 27/40]
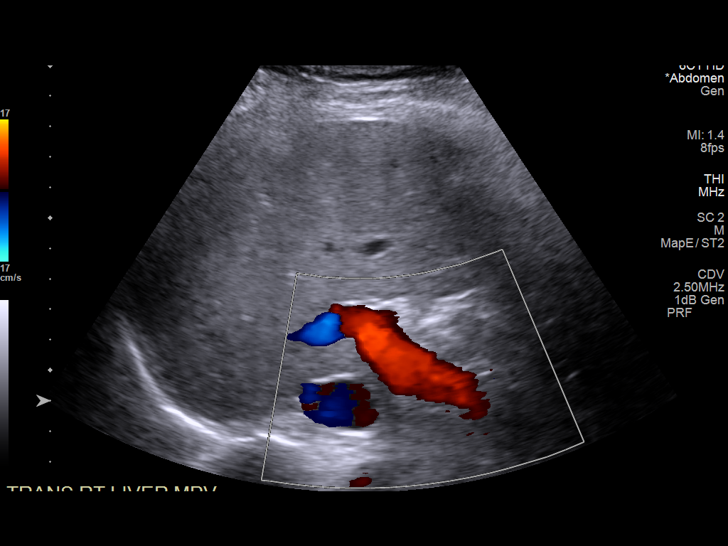
[im 30/40]
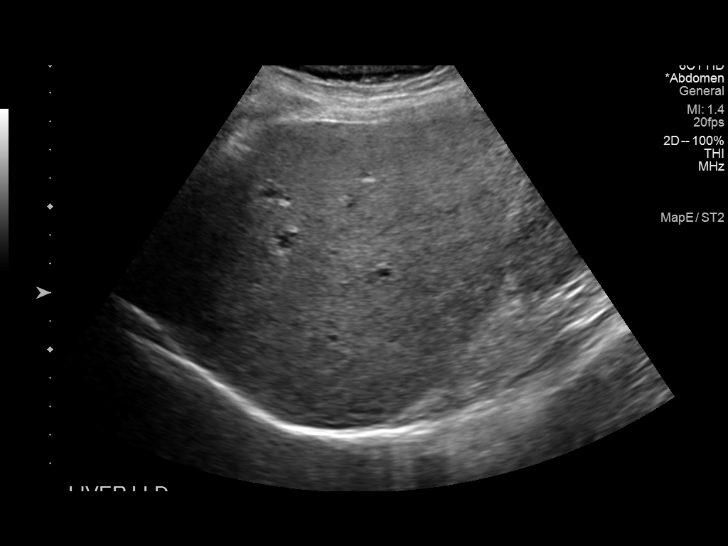
[im 33/40]
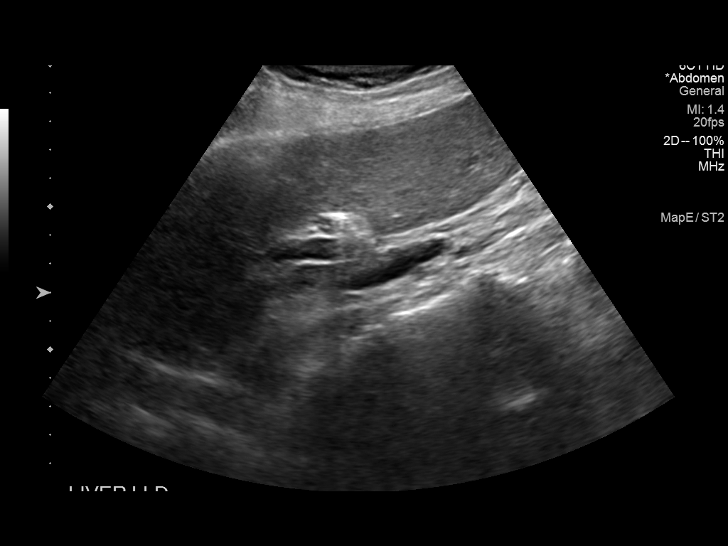
[im 36/40]
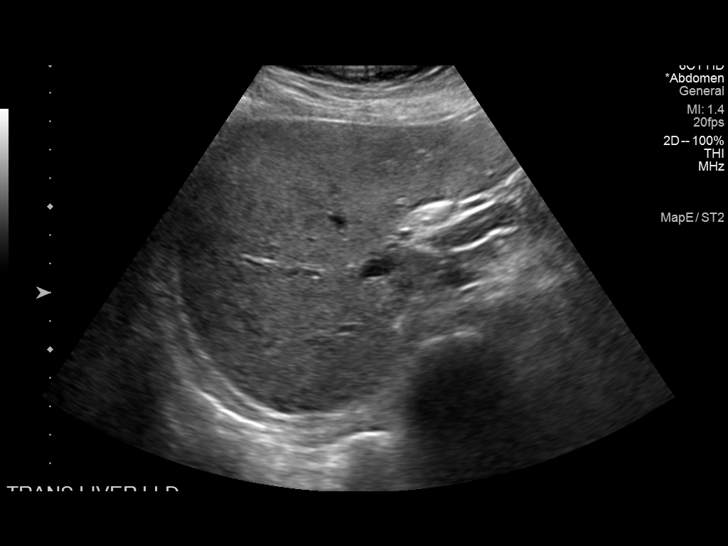
[im 40/40]
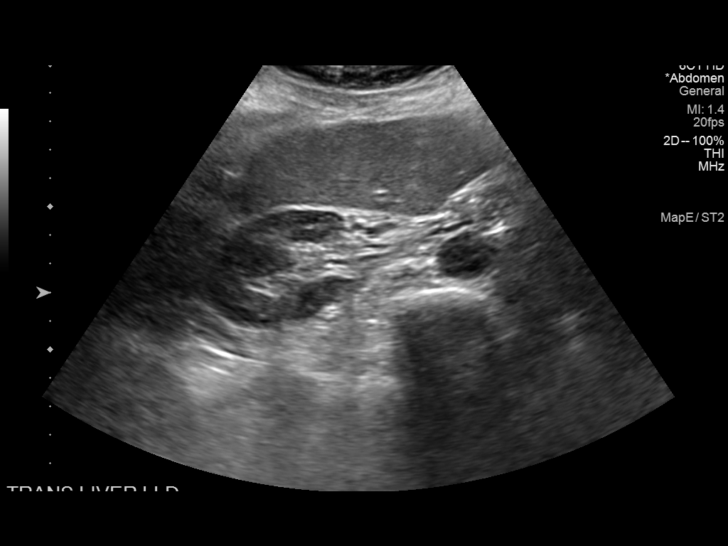

[14 of 25 positions shown; findings below may reference images not displayed]

FINDINGS: Gallbladder:

Surgically absent.

Common bile duct:

Diameter: 4.5 mm

Liver:

Mildly echogenic and mildly heterogeneous. No mass seen. Portal vein
is patent on color Doppler imaging with normal direction of blood
flow towards the liver.

Other: No free peritoneal fluid.
IMPRESSION: 1. Mildly echogenic and mildly heterogeneous liver compatible with
the history of NASH.
2. No liver mass seen.
3. Status post cholecystectomy.

## 2021-07-31 ENCOUNTER — Other Ambulatory Visit: Payer: Self-pay | Admitting: Nurse Practitioner

## 2021-07-31 DIAGNOSIS — K7402 Hepatic fibrosis, advanced fibrosis: Secondary | ICD-10-CM

## 2021-07-31 DIAGNOSIS — K7581 Nonalcoholic steatohepatitis (NASH): Secondary | ICD-10-CM

## 2021-08-07 ENCOUNTER — Ambulatory Visit
Admission: RE | Admit: 2021-08-07 | Discharge: 2021-08-07 | Disposition: A | Discharge status: Home / Self Care | Payer: 59 | Source: Ambulatory Visit | Attending: Nurse Practitioner | Admitting: Nurse Practitioner

## 2021-08-07 DIAGNOSIS — K7581 Nonalcoholic steatohepatitis (NASH): Secondary | ICD-10-CM

## 2021-08-07 DIAGNOSIS — K7402 Hepatic fibrosis, advanced fibrosis: Secondary | ICD-10-CM

## 2022-01-24 IMAGING — US US ABDOMEN LIMITED
1 series · 14 of 25 positions shown · non-contrast
Comparison: Right upper quadrant abdominal ultrasound 08/13/2020

CLINICAL DATA: Nonalcoholic steatohepatitis. Advanced hepatic
fibrosis.

EXAM:
ULTRASOUND ABDOMEN LIMITED RIGHT UPPER QUADRANT

[Series 1: us abdomen limited · 0.20mm/px · 14 of 38 slices shown]
[im 1/38]
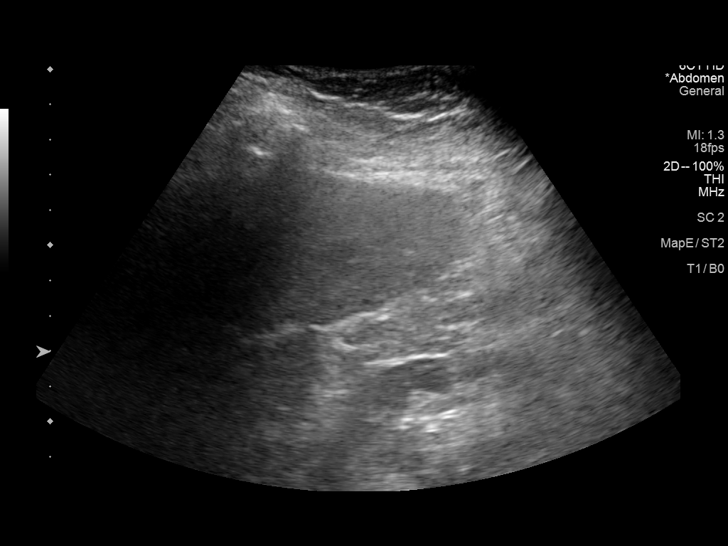
[im 4/38]
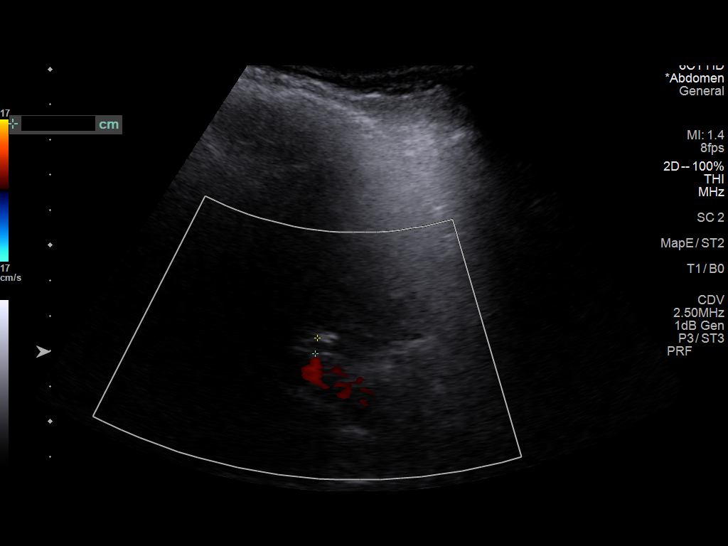
[im 7/38]
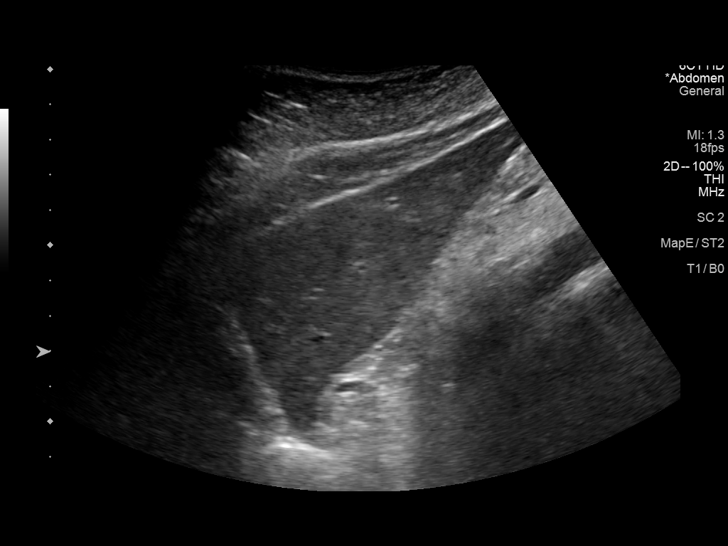
[im 10/38]
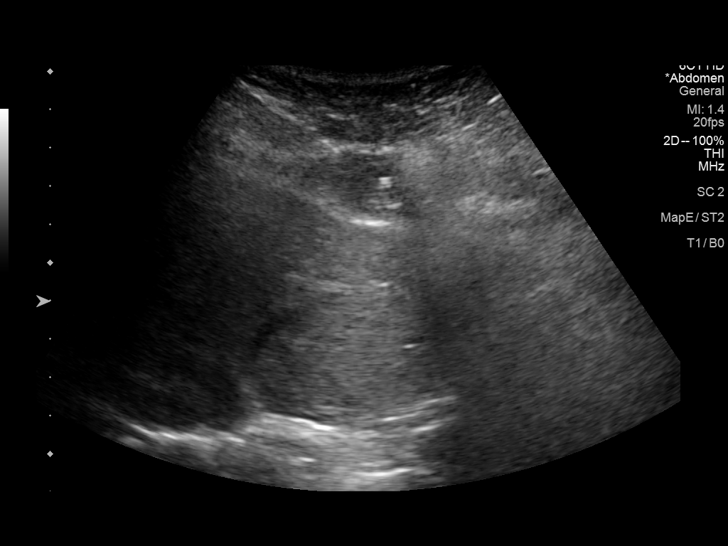
[im 13/38]
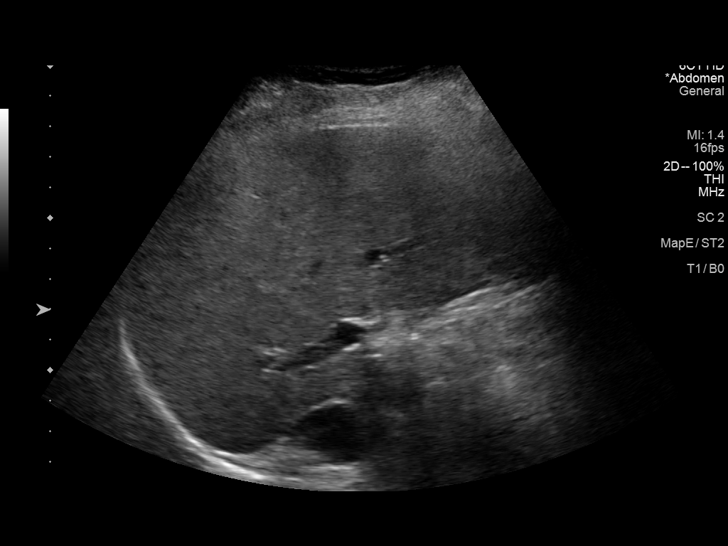
[im 14/38]
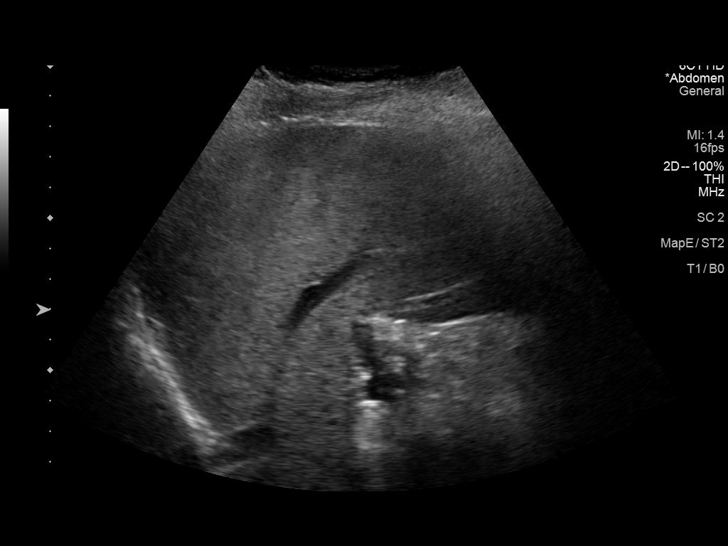
[im 17/38]
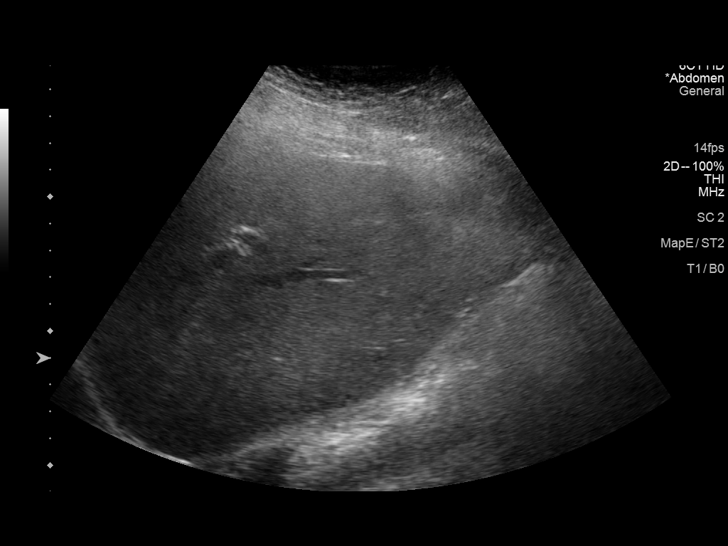
[im 21/38]
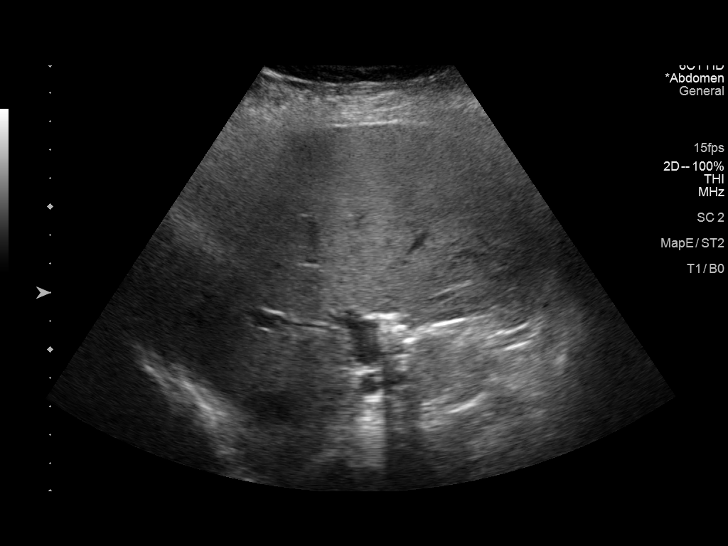
[im 24/38]
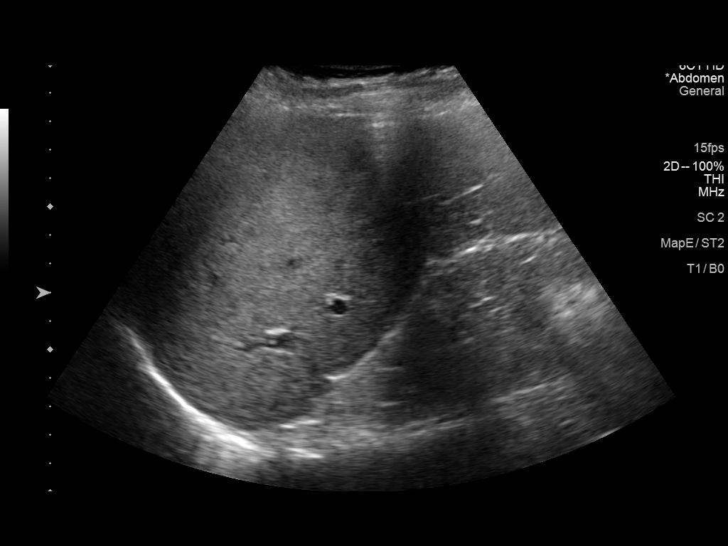
[im 25/38]
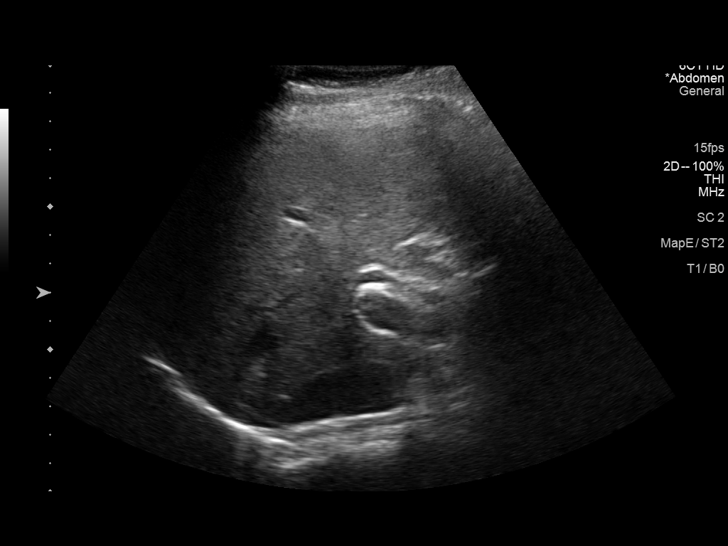
[im 28/38]
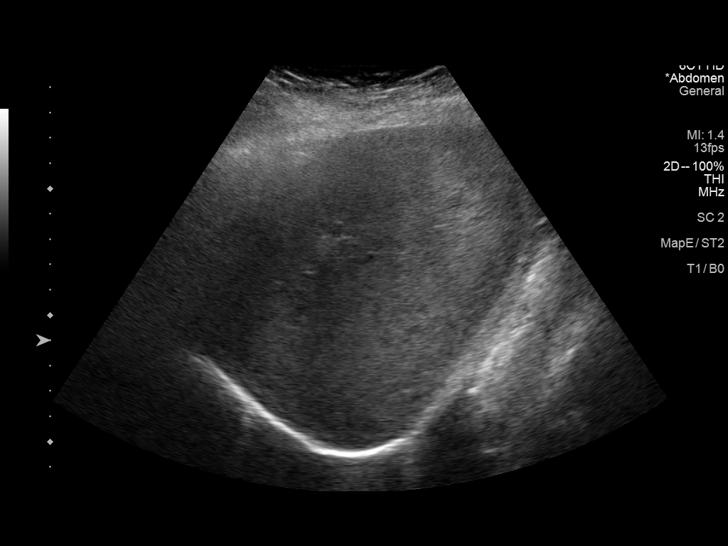
[im 31/38]
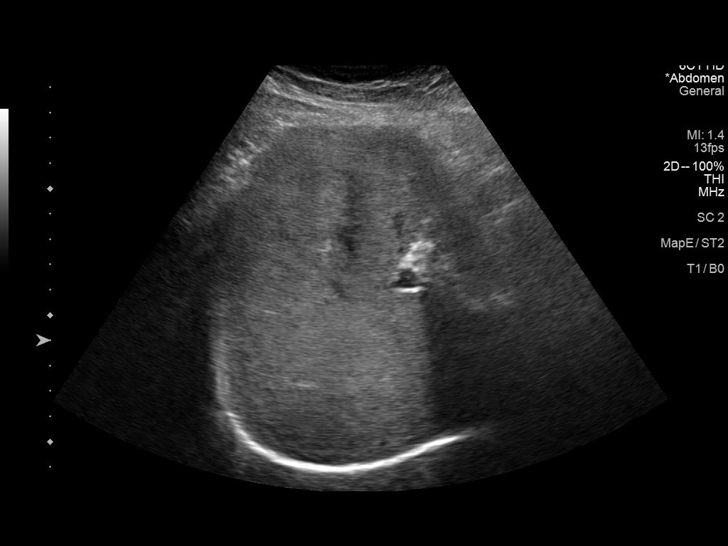
[im 34/38]
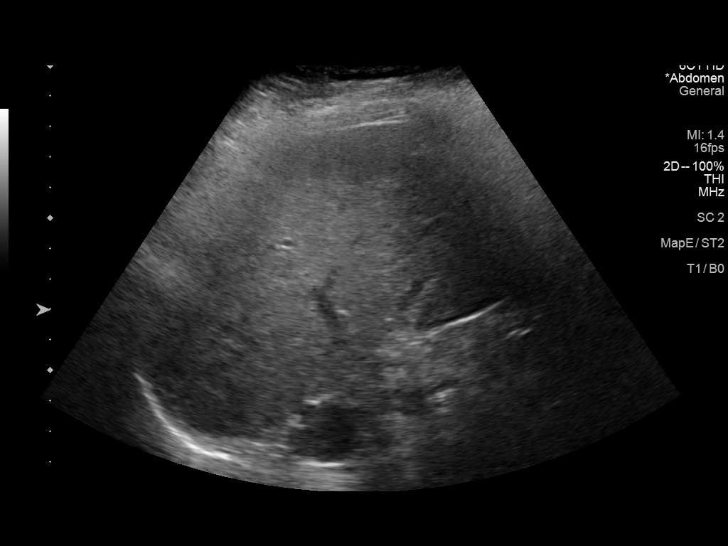
[im 38/38]
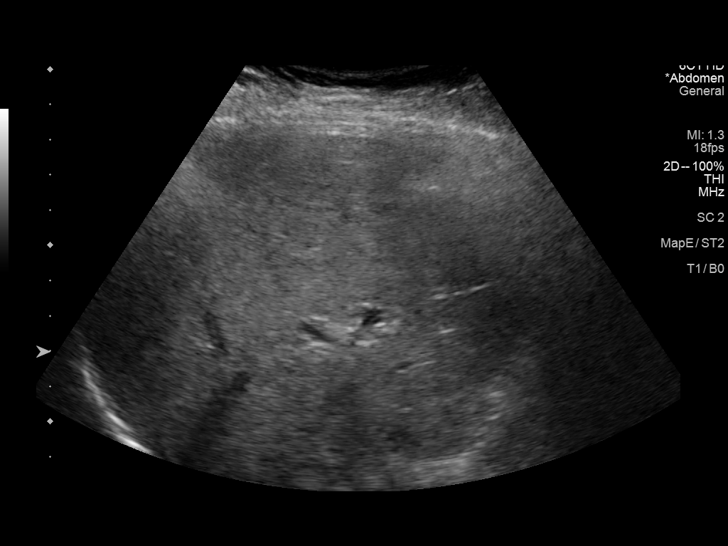

[14 of 25 positions shown; findings below may reference images not displayed]

FINDINGS: Gallbladder:

Surgically absent.

Common bile duct:

Diameter: 5 mm

Liver:

Heterogeneity and increased echogenicity of the liver parenchyma
diffusely, similar to the prior study. No mass identified. Portal
vein is patent on color Doppler imaging with normal direction of
blood flow towards the liver.

Other: None.
IMPRESSION: 1. Heterogeneous and echogenic liver parenchyma, nonspecific but
compatible with the provided history of NASH. No liver mass
identified.
2. Status post cholecystectomy. No biliary dilatation.

## 2022-01-30 ENCOUNTER — Other Ambulatory Visit: Payer: Self-pay | Admitting: Nurse Practitioner

## 2022-01-30 DIAGNOSIS — K7581 Nonalcoholic steatohepatitis (NASH): Secondary | ICD-10-CM

## 2022-01-30 DIAGNOSIS — K7402 Hepatic fibrosis, advanced fibrosis: Secondary | ICD-10-CM

## 2022-02-04 ENCOUNTER — Ambulatory Visit
Admission: RE | Admit: 2022-02-04 | Discharge: 2022-02-04 | Disposition: A | Discharge status: Home / Self Care | Payer: 59 | Source: Ambulatory Visit | Attending: Nurse Practitioner | Admitting: Nurse Practitioner

## 2022-02-04 DIAGNOSIS — K7581 Nonalcoholic steatohepatitis (NASH): Secondary | ICD-10-CM

## 2022-02-04 DIAGNOSIS — K7402 Hepatic fibrosis, advanced fibrosis: Secondary | ICD-10-CM

## 2022-02-26 ENCOUNTER — Encounter: Payer: Self-pay | Admitting: Gastroenterology

## 2022-04-03 ENCOUNTER — Encounter: Payer: Self-pay | Admitting: Gastroenterology

## 2022-04-18 ENCOUNTER — Ambulatory Visit (AMBULATORY_SURGERY_CENTER): Payer: 59 | Admitting: *Deleted

## 2022-04-18 VITALS — Ht 64.0 in | Wt 160.0 lb

## 2022-04-18 DIAGNOSIS — Z800 Family history of malignant neoplasm of digestive organs: Secondary | ICD-10-CM

## 2022-04-18 MED ORDER — NA SULFATE-K SULFATE-MG SULF 17.5-3.13-1.6 GM/177ML PO SOLN: 1.0000 | Freq: Once | ORAL | 0 refills | Status: AC

## 2022-04-18 NOTE — Progress Notes (Signed)
No egg or soy allergy known to patient  No issues known to pt with past sedation with any surgeries or procedures Patient denies ever being told they had issues or difficulty with intubation  No FH of Malignant Hyperthermia Pt is not on diet pills Pt is not on  home 02  Pt is not on blood thinners  Pt denies issues with constipation  No A fib or A flutter Have any cardiac testing pending--NO Pt instructed to use Singlecare.com or GoodRx for a price reduction on prep    Patient's chart reviewed by Osvaldo Angst CNRA prior to previsit and patient appropriate for the [facility].  Previsit completed and red dot placed by patient's name on their procedure day (on provider's schedule).

## 2022-04-23 ENCOUNTER — Encounter: Payer: Self-pay | Admitting: Gastroenterology

## 2022-04-28 ENCOUNTER — Encounter: Payer: 59 | Admitting: Gastroenterology

## 2022-05-11 ENCOUNTER — Encounter: Payer: Self-pay | Admitting: Certified Registered Nurse Anesthetist

## 2022-05-13 ENCOUNTER — Ambulatory Visit (AMBULATORY_SURGERY_CENTER): Payer: 59 | Admitting: Gastroenterology

## 2022-05-13 ENCOUNTER — Encounter: Payer: Self-pay | Admitting: Gastroenterology

## 2022-05-13 VITALS — BP 118/68 | HR 76 | Temp 97.7°F | Resp 13 | Ht 64.0 in | Wt 160.0 lb

## 2022-05-13 DIAGNOSIS — D124 Benign neoplasm of descending colon: Secondary | ICD-10-CM

## 2022-05-13 DIAGNOSIS — D125 Benign neoplasm of sigmoid colon: Secondary | ICD-10-CM | POA: Diagnosis not present

## 2022-05-13 DIAGNOSIS — Z1211 Encounter for screening for malignant neoplasm of colon: Secondary | ICD-10-CM | POA: Diagnosis present

## 2022-05-13 DIAGNOSIS — K5140 Inflammatory polyps of colon without complications: Secondary | ICD-10-CM | POA: Diagnosis not present

## 2022-05-13 DIAGNOSIS — D123 Benign neoplasm of transverse colon: Secondary | ICD-10-CM | POA: Diagnosis not present

## 2022-05-13 DIAGNOSIS — Z800 Family history of malignant neoplasm of digestive organs: Secondary | ICD-10-CM | POA: Diagnosis not present

## 2022-05-13 MED ORDER — SODIUM CHLORIDE 0.9 % IV SOLN: 500.0000 mL | Freq: Once | INTRAVENOUS | Status: DC

## 2022-05-13 NOTE — Patient Instructions (Signed)
-  Handout on polyps, diverticulosis provided -await pathology results -repeat colonoscopy for surveillance recommended. Date to be determined when pathology result become available   -Continue present medications  -clip card provided -No aspirin, ibuprofen, naproxen, or other non-steroidal anti-inflammatory drugs for 2 weeks after polyp removal.  YOU HAD AN ENDOSCOPIC PROCEDURE TODAY AT [facility]:   Refer to the procedure report that was given to you for any specific questions about what was found during the examination.  If the procedure report does not answer your questions, please call your gastroenterologist to clarify.  If you requested that your care partner not be given the details of your procedure findings, then the procedure report has been included in a sealed envelope for you to review at your convenience later.  YOU SHOULD EXPECT: Some feelings of bloating in the abdomen. Passage of more gas than usual.  Walking can help get rid of the air that was put into your GI tract during the procedure and reduce the bloating. If you had a lower endoscopy (such as a colonoscopy or flexible sigmoidoscopy) you may notice spotting of blood in your stool or on the toilet paper. If you underwent a bowel prep for your procedure, you may not have a normal bowel movement for a few days.  Please Note:  You might notice some irritation and congestion in your nose or some drainage.  This is from the oxygen used during your procedure.  There is no need for concern and it should clear up in a day or so.  SYMPTOMS TO REPORT IMMEDIATELY:  Following lower endoscopy (colonoscopy or flexible sigmoidoscopy):  Excessive amounts of blood in the stool  Significant tenderness or worsening of abdominal pains  Swelling of the abdomen that is new, acute  Fever of 100F or higher  For urgent or emergent issues, a gastroenterologist can be reached at any hour by calling [phone]. Do not use  MyChart messaging for urgent concerns.    DIET:  We do recommend a small meal at first, but then you may proceed to your regular diet.  Drink plenty of fluids but you should avoid alcoholic beverages for 24 hours.  ACTIVITY:  You should plan to take it easy for the rest of today and you should NOT DRIVE or use heavy machinery until tomorrow (because of the sedation medicines used during the test).    FOLLOW UP: Our staff will call the number listed on your records the next business day following your procedure.  We will call around 7:15- 8:00 am to check on you and address any questions or concerns that you may have regarding the information given to you following your procedure. If we do not reach you, we will leave a message.     If any biopsies were taken you will be contacted by phone or by letter within the next 1-3 weeks.  Please call us at [phone] if you have not heard about the biopsies in 3 weeks.    SIGNATURES/CONFIDENTIALITY: You and/or your care partner have signed paperwork which will be entered into your electronic medical record.  These signatures attest to the fact that that the information above on your After Visit Summary has been reviewed and is understood.  Full responsibility of the confidentiality of this discharge information lies with you and/or your care-partner.

## 2022-05-13 NOTE — Progress Notes (Signed)
Report given to PACU, vss 

## 2022-05-13 NOTE — Progress Notes (Signed)
History & Physical  Primary Care Physician:  Kathyrn Lass, MD Primary Gastroenterologist: Lucio Edward, MD  Impression / Plan:  Family history of colon cancer, multiple second-degree relatives, for colonoscopy.  CHIEF COMPLAINT:  [facility]  HPI: Theresa Bell is a 62 y.o. female with a family history of colon cancer, multiple second-degree relatives, for colonoscopy.   Past Medical History:  Diagnosis Date   Allergy    Arthritis    Asthma    Back pain    Blood transfusion without reported diagnosis    Diverticulosis    Hypertension     Past Surgical History:  Procedure Laterality Date   CHOLECYSTECTOMY     COLONOSCOPY     POLYPECTOMY     TEMPOROMANDIBULAR JOINT ARTHROSCOPY     TUBAL LIGATION      Prior to Admission medications   Medication Sig Start Date End Date Taking? Authorizing Provider  calcium carbonate (TUMS - DOSED IN MG ELEMENTAL CALCIUM) 500 MG chewable tablet Chew 2 tablets by mouth daily as needed for indigestion. Plus calcium   Yes [provider]  Cholecalciferol (VITAMIN D3) 50 MCG (2000 UT) TABS Take 2,000 Units by mouth daily.   Yes [provider]  ELDERBERRY PO Take by mouth. TAKE 1 GUMMY EVERY OTHER DAY   Yes [provider]  Loratadine (KS ALLERCLEAR PO) Take 10 mg by mouth daily.    Yes [provider]  triamterene-hydrochlorothiazide (MAXZIDE-25) 37.5-25 MG tablet Take one half tablet daily Patient taking differently: Take 0.5 tablets by mouth daily. 09/19/15  Yes Darlyne Russian, MD  diclofenac Sodium (VOLTAREN) 1 % GEL Apply 2 g topically every 30 (thirty) days. Patient not taking: Reported on 04/18/2022    [provider]  ibuprofen (ADVIL,MOTRIN) 200 MG tablet Take 200 mg by mouth daily as needed for headache.     [provider]    Current Outpatient Medications  Medication Sig Dispense Refill   calcium carbonate (TUMS - DOSED IN MG ELEMENTAL CALCIUM) 500 MG chewable tablet Chew 2  tablets by mouth daily as needed for indigestion. Plus calcium     Cholecalciferol (VITAMIN D3) 50 MCG (2000 UT) TABS Take 2,000 Units by mouth daily.     ELDERBERRY PO Take by mouth. TAKE 1 GUMMY EVERY OTHER DAY     Loratadine (KS ALLERCLEAR PO) Take 10 mg by mouth daily.      triamterene-hydrochlorothiazide (MAXZIDE-25) 37.5-25 MG tablet Take one half tablet daily (Patient taking differently: Take 0.5 tablets by mouth daily.) 45 tablet 3   diclofenac Sodium (VOLTAREN) 1 % GEL Apply 2 g topically every 30 (thirty) days. (Patient not taking: Reported on 04/18/2022)     ibuprofen (ADVIL,MOTRIN) 200 MG tablet Take 200 mg by mouth daily as needed for headache.      Current Facility-Administered Medications  Medication Dose Route Frequency Provider Last Rate Last Admin   0.9 %  sodium chloride infusion  500 mL Intravenous Once Ladene Artist, MD        Allergies as of 05/13/2022 - Review Complete 05/13/2022  Allergen Reaction Noted   Amoxicillin Other (See Comments) 04/06/2017   Codeine     Darvocet [propoxyphene n-acetaminophen] Other (See Comments) 04/26/2011   Darvon Other (See Comments) 04/26/2011   Dust mite extract Other (See Comments) 04/18/2022   Molds & smuts Other (See Comments) 04/18/2022    Family History  Problem Relation Age of Onset   Colon cancer Mother    Lung cancer Father  Hypertension Father    Hyperlipidemia Father    Colon polyps Father    Breast cancer Paternal Aunt    Uterine cancer Maternal Grandmother    Colon cancer Maternal Grandmother    Cancer Paternal Grandmother    Rectal cancer Cousin    Rectal cancer Cousin    Esophageal cancer Neg Hx    Crohn's disease Neg Hx    Stomach cancer Neg Hx    Ulcerative colitis Neg Hx     Social History   Socioeconomic History   Marital status: Married    Spouse name: Not on file   Number of children: Not on file   Years of education: Not on file   Highest education level: Not on file  Occupational History    Occupation: mental health professional    Employer: [employer]  Tobacco Use   Smoking status: Former    Types: Cigarettes    Start date: 11/10/1982   Smokeless tobacco: Never   Tobacco comments:    quit at age 63  Vaping Use   Vaping Use: Never used  Substance and Sexual Activity   Alcohol use: Yes    Alcohol/week: 1.0 standard drink of alcohol    Types: 1 Standard drinks or equivalent per week    Comment: OCCASSIONALLY   Drug use: No   Sexual activity: Not on file  Other Topics Concern   Not on file  Social History Narrative   Not on file   Social Determinants of Health   Financial Resource Strain: Not on file  Food Insecurity: Not on file  Transportation Needs: Not on file  Physical Activity: Not on file  Stress: Not on file  Social Connections: Not on file  Intimate Partner Violence: Not on file    Review of Systems:  All systems reviewed were negative except where noted in HPI.   Physical Exam: General:  Alert, well-developed, in NAD Head:  Normocephalic and atraumatic. Eyes:  Sclera clear, no icterus.   Conjunctiva pink. Ears:  Normal auditory acuity. Mouth:  No deformity or lesions.  Neck:  Supple; no masses. Lungs:  Clear throughout to auscultation.   No wheezes, crackles, or rhonchi.  Heart:  Regular rate and rhythm; no murmurs. Abdomen:  Soft, nondistended, nontender. No masses, hepatomegaly. No palpable masses.  Normal bowel sounds.    Rectal:  Deferred   Msk:  Symmetrical without gross deformities. Extremities:  Without edema. Neurologic:  Alert and  oriented x 4; grossly normal neurologically. Skin:  Intact without significant lesions or rashes. Psych:  Alert and cooperative. Normal mood and affect.   Pricilla Riffle. Fuller Plan  05/13/2022, 8:17 AM See Shea Evans, [facility], to contact our on call provider

## 2022-05-13 NOTE — Op Note (Signed)
[name] Procedure Date: 05/13/2022 8:15 AM MRN: [id number] Endoscopist: Ladene Artist , MD, [id number] Age: 62 Referring MD:  Date of Birth: 1960-05-14 Gender: Female Account #: [id number] Procedure:                Colonoscopy Indications:              Screening for colon cancer: Family history of                            colorectal cancer in multiple 2nd degree relatives Medicines:                Monitored Anesthesia Care Procedure:                Pre-Anesthesia Assessment:                           - Prior to the procedure, a History and Physical                            was performed, and patient medications and                            allergies were reviewed. The patient's tolerance of                            previous anesthesia was also reviewed. The risks                            and benefits of the procedure and the sedation                            options and risks were discussed with the patient.                            All questions were answered, and informed consent                            was obtained. Prior Anticoagulants: The patient has                            taken no anticoagulant or antiplatelet agents. ASA                            Grade Assessment: II - A patient with mild systemic                            disease. After reviewing the risks and benefits,                            the patient was deemed in satisfactory condition to                            undergo the procedure.  After obtaining informed consent, the colonoscope                            was passed under direct vision. Throughout the                            procedure, the patient's blood pressure, pulse, and                            oxygen saturations were monitored continuously. The                            CF [id number] was introduced through the anus                            and  advanced to the the cecum, identified by                            appendiceal orifice and ileocecal valve. The                            ileocecal valve, appendiceal orifice, and rectum                            were photographed. The quality of the bowel                            preparation was good. The colonoscopy was performed                            without difficulty. The patient tolerated the                            procedure well. Scope In: 8:28:58 AM Scope Out: 8:46:52 AM Scope Withdrawal Time: 0 hours 15 minutes 46 seconds  Total Procedure Duration: 0 hours 17 minutes 54 seconds  Findings:                 The perianal and digital rectal examinations were                            normal.                           Three sessile polyps were found in the sigmoid                            colon and transverse colon. The polyps were 5 to 7                            mm in size. These polyps were removed with a cold                            snare. Resection and retrieval were complete.  A 10 mm polyp was found in the descending colon.                            The polyp was pedunculated. The polyp was removed                            with a cold snare. Resection and retrieval were                            complete. Persistent oozing post polypectomy. For                            hemostasis, two hemostatic clips were successfully                            placed (MR conditional). Clip manufacturer:                            [employer]. There was no bleeding at the end of                            the procedure.                           A few small-mouthed diverticula were found in the                            left colon. There was no evidence of diverticular                            bleeding.                           The exam was otherwise without abnormality on                            direct and retroflexion  views. Complications:            No immediate complications. Estimated blood loss:                            None. Estimated Blood Loss:     Estimated blood loss: none. Impression:               - Three 5 to 7 mm polyps in the sigmoid colon and                            in the transverse colon, removed with a cold snare.                            Resected and retrieved.                           - One 10 mm polyp in the descending colon, removed  with a cold snare. Resected and retrieved. Clips                            (MR conditional) were placed. Clip manufacturer:                            [employer]                           - Mild diverticulosis in the left colon.                           - The examination was otherwise normal on direct                            and retroflexion views. Recommendation:           - Repeat colonoscopy after studies are complete for                            surveillance based on pathology results.                           - Patient has a contact number available for                            emergencies. The signs and symptoms of potential                            delayed complications were discussed with the                            patient. Return to normal activities tomorrow.                            Written discharge instructions were provided to the                            patient.                           - Resume previous diet.                           - Continue present medications.                           - Await pathology results.                           - No aspirin, ibuprofen, naproxen, or other                            non-steroidal anti-inflammatory drugs for 2 weeks                            after polyp removal. Norberto Sorenson  Sindy Guadeloupe, MD 05/13/2022 8:52:08 AM This report has been signed electronically.

## 2022-05-13 NOTE — Progress Notes (Signed)
Pt's states no medical or surgical changes since previsit or office visit. 

## 2022-05-13 NOTE — Progress Notes (Signed)
Called to room to assist during endoscopic procedure.  Patient ID and intended procedure confirmed with present staff. Received instructions for my participation in the procedure from the performing physician.  

## 2022-05-14 ENCOUNTER — Telehealth: Payer: Self-pay | Admitting: *Deleted

## 2022-05-14 NOTE — Telephone Encounter (Signed)
  Follow up Call-     05/13/2022    8:08 AM  Call back number  Post procedure Call Back phone  # [phone]  Permission to leave phone message Yes     Patient questions:  Do you have a fever, pain , or abdominal swelling? No. Pain Score  0 *  Have you tolerated food without any problems? Yes.    Have you been able to return to your normal activities? Yes.    Do you have any questions about your discharge instructions: Diet   No. Medications  No. Follow up visit  No.  Do you have questions or concerns about your Care? No.  Actions: * If pain score is 4 or above: No action needed, pain <4.

## 2022-06-03 ENCOUNTER — Encounter: Payer: Self-pay | Admitting: Gastroenterology

## 2022-07-12 IMAGING — US US ABDOMEN LIMITED
1 series · 14 of 25 positions shown · non-contrast
Comparison: Abdominal ultrasound 02/19/2021

CLINICAL DATA: NASH, fibrosis

EXAM:
ULTRASOUND ABDOMEN LIMITED RIGHT UPPER QUADRANT

[Series 1: us abdomen limited · 0.23mm/px · 14 of 37 slices shown]
[im 1/37]
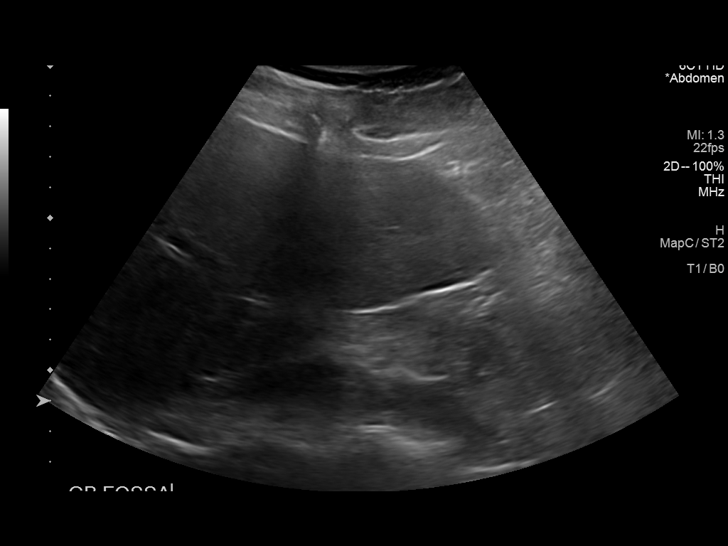
[im 4/37]
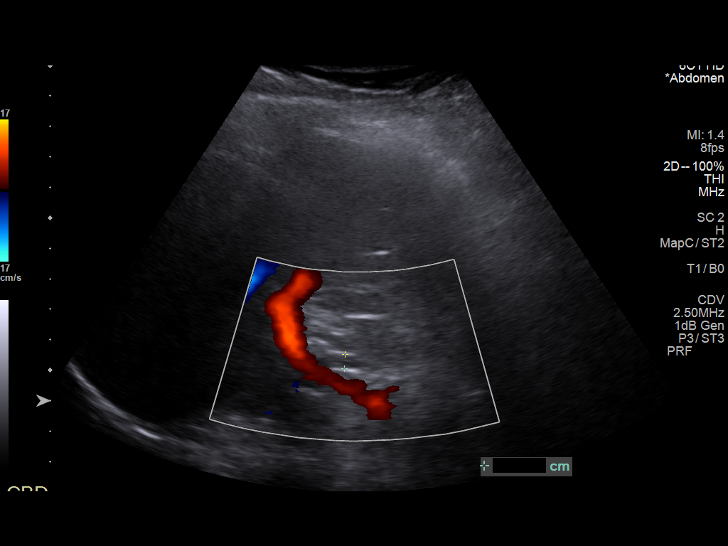
[im 7/37]
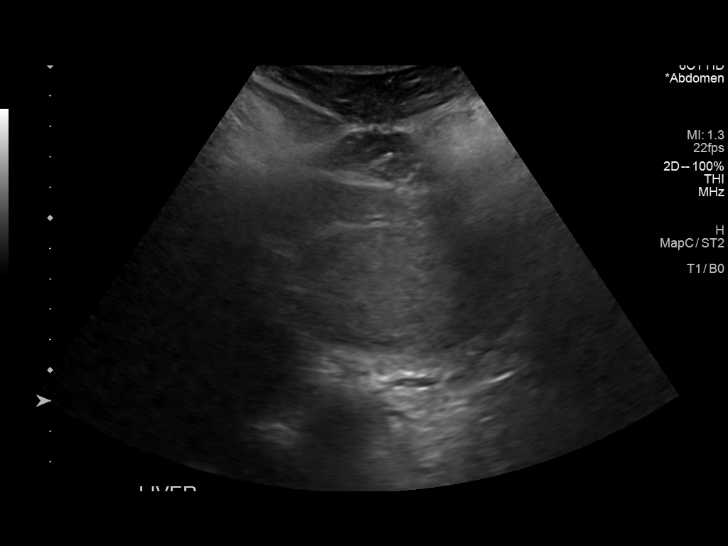
[im 10/37]
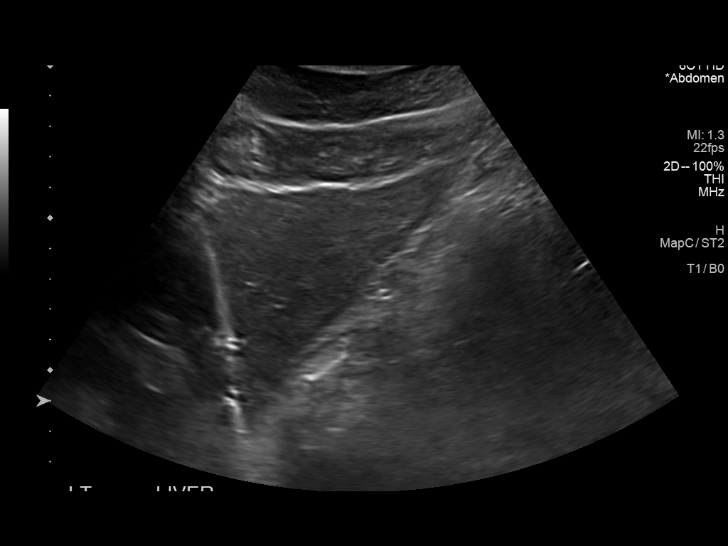
[im 13/37]
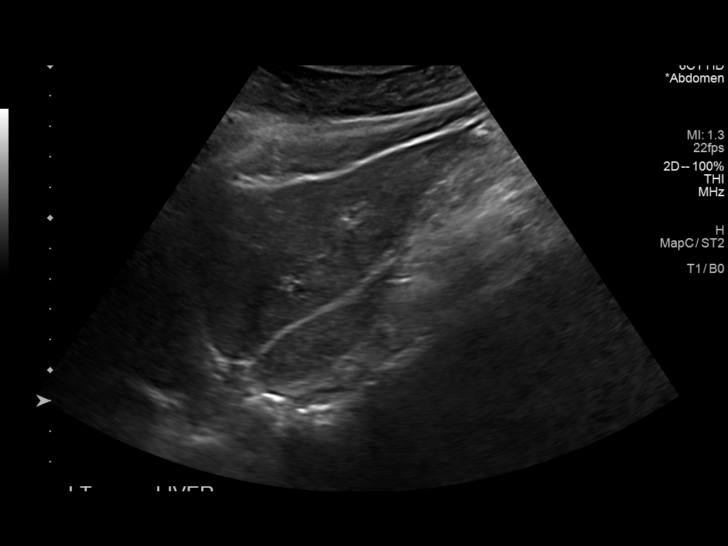
[im 14/37]
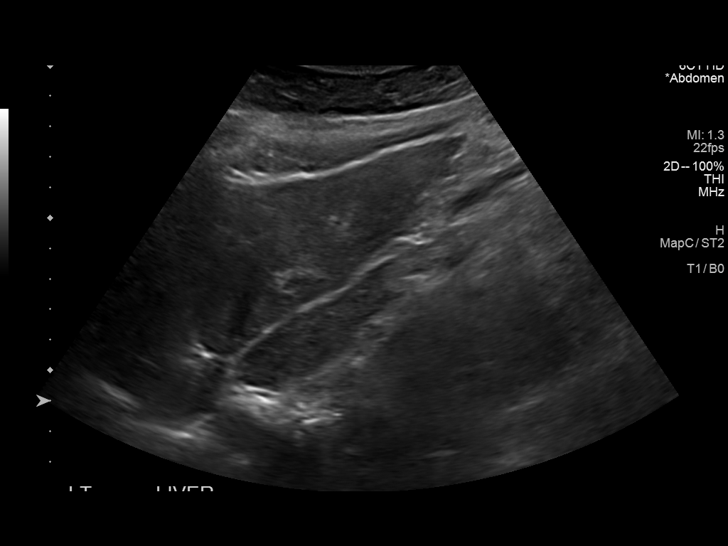
[im 17/37]
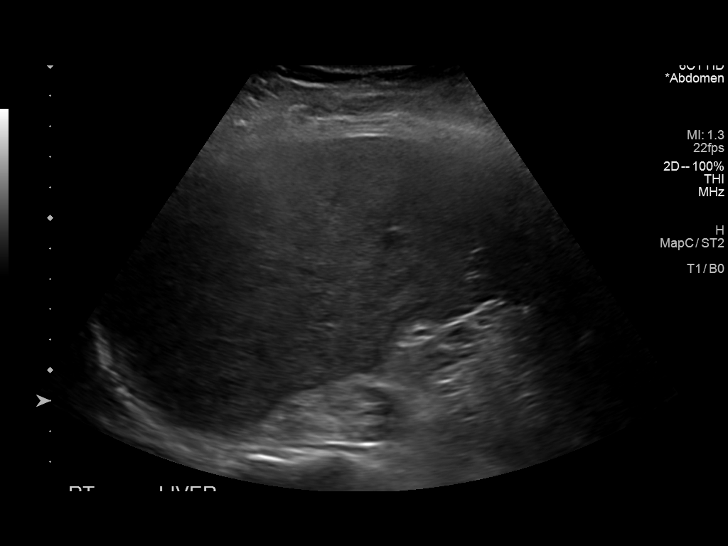
[im 20/37]
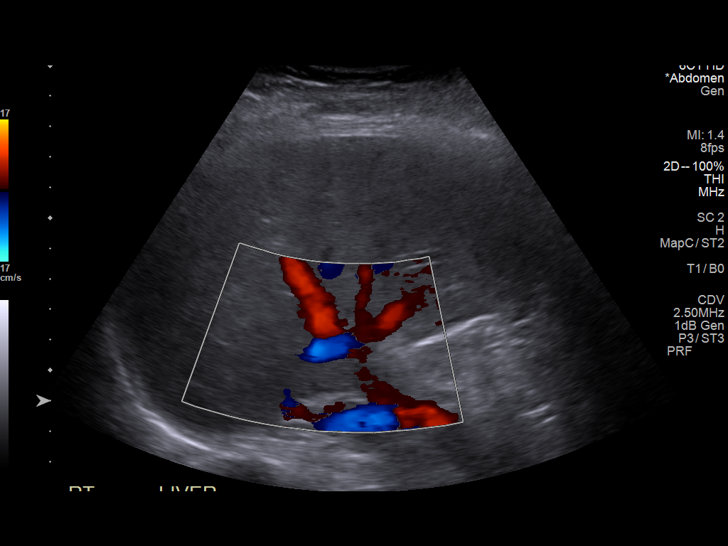
[im 23/37]
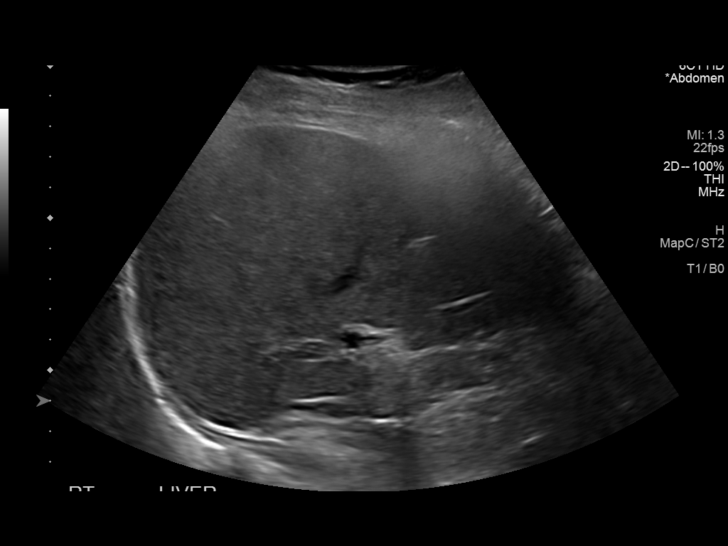
[im 25/37]
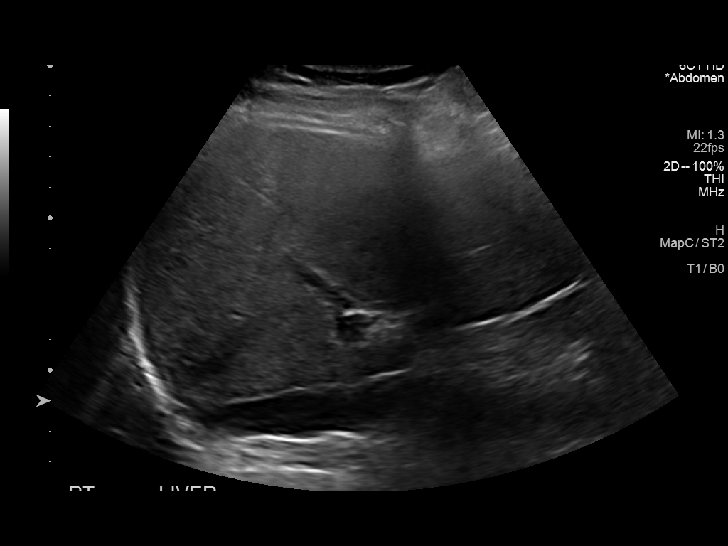
[im 28/37]
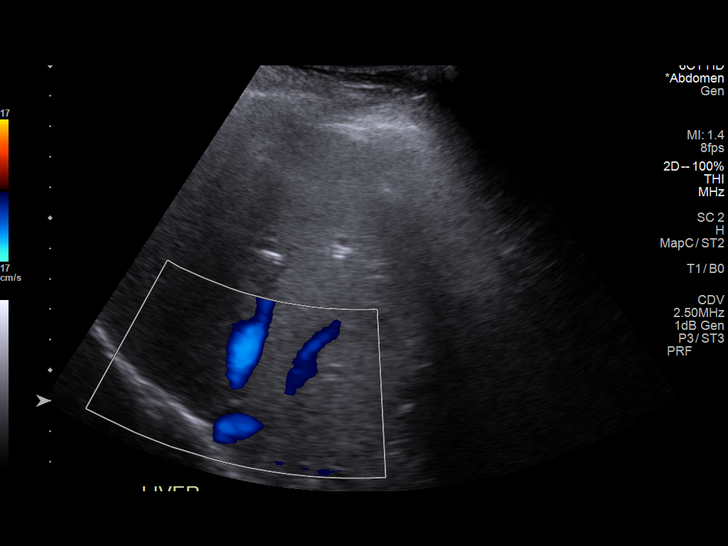
[im 31/37]
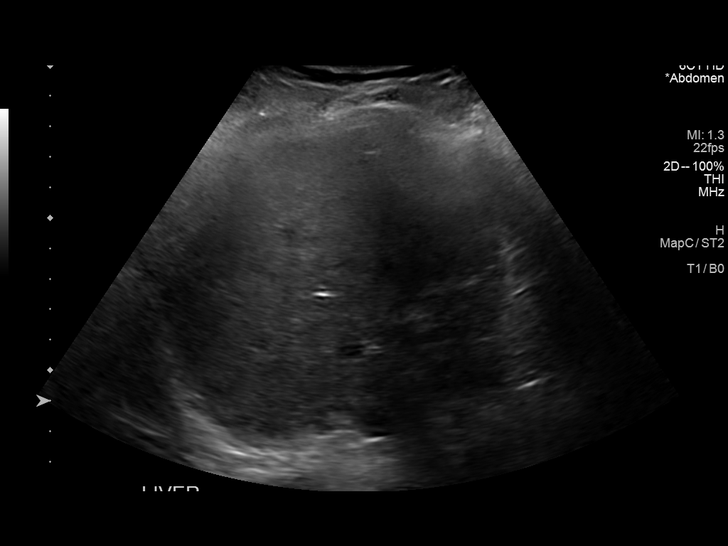
[im 34/37]
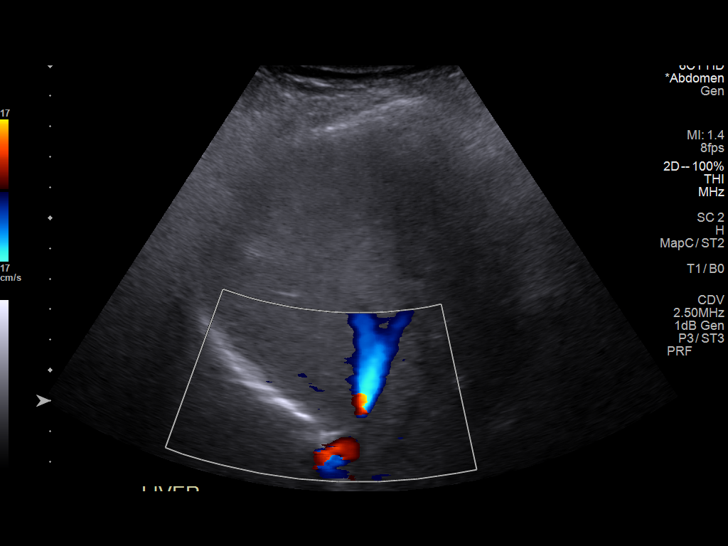
[im 37/37]
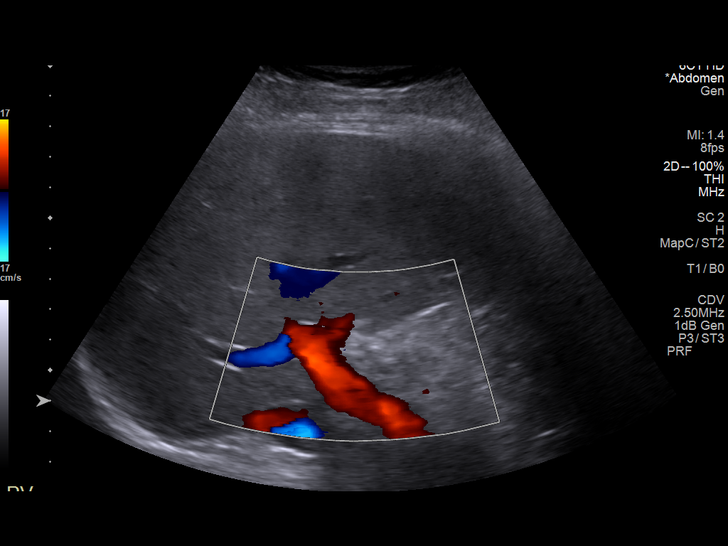

[14 of 25 positions shown; findings below may reference images not displayed]

FINDINGS: Gallbladder:

Surgically absent.

Common bile duct:

Diameter: 5 mm

Liver:

Coarse, mildly increased echogenicity of the parenchyma with no
focal mass identified. Portal vein is patent on color Doppler
imaging with normal direction of blood flow towards the liver.

Other: None.
IMPRESSION: Evidence of hepatic steatosis and/or other hepatocellular disease.
No hepatic mass identified.

## 2022-08-07 ENCOUNTER — Other Ambulatory Visit: Payer: Self-pay | Admitting: Nurse Practitioner

## 2022-08-07 DIAGNOSIS — K7402 Hepatic fibrosis, advanced fibrosis: Secondary | ICD-10-CM

## 2022-08-07 DIAGNOSIS — K7581 Nonalcoholic steatohepatitis (NASH): Secondary | ICD-10-CM

## 2022-08-19 ENCOUNTER — Ambulatory Visit
Admission: RE | Admit: 2022-08-19 | Discharge: 2022-08-19 | Disposition: A | Discharge status: Home / Self Care | Payer: 59 | Source: Ambulatory Visit | Attending: Nurse Practitioner | Admitting: Nurse Practitioner

## 2022-08-19 DIAGNOSIS — K7402 Hepatic fibrosis, advanced fibrosis: Secondary | ICD-10-CM

## 2022-08-19 DIAGNOSIS — K7581 Nonalcoholic steatohepatitis (NASH): Secondary | ICD-10-CM

## 2023-01-29 ENCOUNTER — Other Ambulatory Visit: Payer: Self-pay | Admitting: Nurse Practitioner

## 2023-01-29 DIAGNOSIS — K7581 Nonalcoholic steatohepatitis (NASH): Secondary | ICD-10-CM

## 2023-01-29 DIAGNOSIS — K7402 Hepatic fibrosis, advanced fibrosis: Secondary | ICD-10-CM

## 2023-02-02 ENCOUNTER — Ambulatory Visit
Admission: RE | Admit: 2023-02-02 | Discharge: 2023-02-02 | Disposition: A | Discharge status: Home / Self Care | Payer: 59 | Source: Ambulatory Visit | Attending: Nurse Practitioner | Admitting: Nurse Practitioner

## 2023-02-02 DIAGNOSIS — K7402 Hepatic fibrosis, advanced fibrosis: Secondary | ICD-10-CM

## 2023-02-02 DIAGNOSIS — K7581 Nonalcoholic steatohepatitis (NASH): Secondary | ICD-10-CM

## 2023-03-17 ENCOUNTER — Other Ambulatory Visit (HOSPITAL_BASED_OUTPATIENT_CLINIC_OR_DEPARTMENT_OTHER): Payer: Self-pay | Admitting: Family Medicine

## 2023-03-17 DIAGNOSIS — E785 Hyperlipidemia, unspecified: Secondary | ICD-10-CM

## 2023-03-31 ENCOUNTER — Ambulatory Visit (HOSPITAL_COMMUNITY)
Admission: RE | Admit: 2023-03-31 | Discharge: 2023-03-31 | Disposition: A | Discharge status: Home / Self Care | Payer: Self-pay | Source: Ambulatory Visit | Attending: Family Medicine | Admitting: Family Medicine

## 2023-03-31 DIAGNOSIS — E785 Hyperlipidemia, unspecified: Secondary | ICD-10-CM | POA: Insufficient documentation

## 2023-08-03 ENCOUNTER — Other Ambulatory Visit: Payer: Self-pay | Admitting: Nurse Practitioner

## 2023-08-03 DIAGNOSIS — K7581 Nonalcoholic steatohepatitis (NASH): Secondary | ICD-10-CM

## 2023-08-03 DIAGNOSIS — K7402 Hepatic fibrosis, advanced fibrosis: Secondary | ICD-10-CM

## 2023-08-04 ENCOUNTER — Ambulatory Visit
Admission: RE | Admit: 2023-08-04 | Discharge: 2023-08-04 | Disposition: A | Discharge status: Home / Self Care | Source: Ambulatory Visit | Attending: Nurse Practitioner | Admitting: Nurse Practitioner

## 2023-08-04 DIAGNOSIS — K7581 Nonalcoholic steatohepatitis (NASH): Secondary | ICD-10-CM

## 2023-08-04 DIAGNOSIS — K7402 Hepatic fibrosis, advanced fibrosis: Secondary | ICD-10-CM

## 2024-02-04 ENCOUNTER — Other Ambulatory Visit: Payer: Self-pay | Admitting: Nurse Practitioner

## 2024-02-04 DIAGNOSIS — K7402 Hepatic fibrosis, advanced fibrosis: Secondary | ICD-10-CM

## 2024-02-04 DIAGNOSIS — K7581 Nonalcoholic steatohepatitis (NASH): Secondary | ICD-10-CM

## 2024-02-09 ENCOUNTER — Inpatient Hospital Stay: Admission: RE | Admit: 2024-02-09 | Discharge: 2024-02-09 | Attending: Nurse Practitioner

## 2024-02-09 DIAGNOSIS — K7581 Nonalcoholic steatohepatitis (NASH): Secondary | ICD-10-CM

## 2024-02-09 DIAGNOSIS — K7402 Hepatic fibrosis, advanced fibrosis: Secondary | ICD-10-CM
